# Patient Record
Sex: Male | Born: 1980
Health system: Southern US, Community
[De-identification: ages and names within clinical notes are randomized; demographics above are authoritative.]

## PROBLEM LIST (undated history)

## (undated) DIAGNOSIS — K589 Irritable bowel syndrome without diarrhea: Secondary | ICD-10-CM

## (undated) DIAGNOSIS — T7840XA Allergy, unspecified, initial encounter: Secondary | ICD-10-CM

## (undated) DIAGNOSIS — K921 Melena: Secondary | ICD-10-CM

## (undated) DIAGNOSIS — F988 Other specified behavioral and emotional disorders with onset usually occurring in childhood and adolescence: Secondary | ICD-10-CM

## (undated) HISTORY — DX: Melena: K92.1

## (undated) HISTORY — DX: Irritable bowel syndrome, unspecified: K58.9

## (undated) HISTORY — DX: Other specified behavioral and emotional disorders with onset usually occurring in childhood and adolescence: F98.8

## (undated) HISTORY — PX: WRIST SURGERY: SHX841

## (undated) HISTORY — PX: UPPER GASTROINTESTINAL ENDOSCOPY: SHX188

## (undated) HISTORY — DX: Allergy, unspecified, initial encounter: T78.40XA

---

## 2003-09-30 ENCOUNTER — Encounter: Payer: Self-pay | Admitting: Internal Medicine

## 2004-02-24 ENCOUNTER — Ambulatory Visit: Payer: Self-pay | Admitting: Internal Medicine

## 2004-04-29 ENCOUNTER — Ambulatory Visit: Payer: Self-pay | Admitting: Internal Medicine

## 2004-06-20 ENCOUNTER — Ambulatory Visit: Payer: Self-pay | Admitting: Internal Medicine

## 2004-07-11 ENCOUNTER — Ambulatory Visit: Payer: Self-pay | Admitting: Gastroenterology

## 2004-08-10 ENCOUNTER — Ambulatory Visit: Payer: Self-pay | Admitting: Gastroenterology

## 2004-08-18 ENCOUNTER — Encounter (INDEPENDENT_AMBULATORY_CARE_PROVIDER_SITE_OTHER): Payer: Self-pay | Admitting: Specialist

## 2004-08-18 ENCOUNTER — Ambulatory Visit: Payer: Self-pay | Admitting: Gastroenterology

## 2004-09-20 ENCOUNTER — Ambulatory Visit: Payer: Self-pay | Admitting: Gastroenterology

## 2005-06-28 ENCOUNTER — Ambulatory Visit: Payer: Self-pay | Admitting: Internal Medicine

## 2005-12-27 ENCOUNTER — Ambulatory Visit: Payer: Self-pay | Admitting: Internal Medicine

## 2006-06-27 ENCOUNTER — Ambulatory Visit: Payer: Self-pay | Admitting: Internal Medicine

## 2006-08-08 ENCOUNTER — Ambulatory Visit: Payer: Self-pay | Admitting: Internal Medicine

## 2006-08-15 ENCOUNTER — Ambulatory Visit (HOSPITAL_COMMUNITY): Admission: RE | Admit: 2006-08-15 | Discharge: 2006-08-15 | Payer: Self-pay | Admitting: Internal Medicine

## 2006-09-06 ENCOUNTER — Ambulatory Visit (HOSPITAL_COMMUNITY): Admission: RE | Admit: 2006-09-06 | Discharge: 2006-09-06 | Payer: Self-pay | Admitting: Internal Medicine

## 2006-09-17 ENCOUNTER — Ambulatory Visit: Payer: Self-pay | Admitting: Internal Medicine

## 2006-11-12 ENCOUNTER — Telehealth: Payer: Self-pay | Admitting: Internal Medicine

## 2006-11-27 DIAGNOSIS — F988 Other specified behavioral and emotional disorders with onset usually occurring in childhood and adolescence: Secondary | ICD-10-CM | POA: Insufficient documentation

## 2006-11-27 DIAGNOSIS — K589 Irritable bowel syndrome without diarrhea: Secondary | ICD-10-CM | POA: Insufficient documentation

## 2006-12-21 ENCOUNTER — Ambulatory Visit: Payer: Self-pay | Admitting: Internal Medicine

## 2007-02-11 ENCOUNTER — Telehealth: Payer: Self-pay | Admitting: Internal Medicine

## 2007-04-17 ENCOUNTER — Telehealth: Payer: Self-pay | Admitting: Internal Medicine

## 2007-05-20 ENCOUNTER — Ambulatory Visit: Payer: Self-pay | Admitting: Internal Medicine

## 2007-09-16 DIAGNOSIS — J309 Allergic rhinitis, unspecified: Secondary | ICD-10-CM | POA: Insufficient documentation

## 2007-09-16 DIAGNOSIS — R0602 Shortness of breath: Secondary | ICD-10-CM | POA: Insufficient documentation

## 2007-09-17 ENCOUNTER — Ambulatory Visit: Payer: Self-pay | Admitting: Internal Medicine

## 2007-09-22 DIAGNOSIS — J45909 Unspecified asthma, uncomplicated: Secondary | ICD-10-CM | POA: Insufficient documentation

## 2007-09-24 ENCOUNTER — Telehealth: Payer: Self-pay | Admitting: Internal Medicine

## 2007-10-30 ENCOUNTER — Telehealth: Payer: Self-pay | Admitting: Internal Medicine

## 2007-11-20 ENCOUNTER — Ambulatory Visit: Payer: Self-pay | Admitting: Internal Medicine

## 2008-01-21 ENCOUNTER — Telehealth: Payer: Self-pay | Admitting: Internal Medicine

## 2008-03-03 ENCOUNTER — Telehealth: Payer: Self-pay | Admitting: Internal Medicine

## 2008-04-13 ENCOUNTER — Telehealth: Payer: Self-pay | Admitting: Internal Medicine

## 2008-05-27 ENCOUNTER — Ambulatory Visit: Payer: Self-pay | Admitting: Internal Medicine

## 2008-06-25 ENCOUNTER — Telehealth: Payer: Self-pay | Admitting: Internal Medicine

## 2008-08-05 ENCOUNTER — Telehealth: Payer: Self-pay | Admitting: Internal Medicine

## 2008-09-08 ENCOUNTER — Telehealth: Payer: Self-pay | Admitting: Family Medicine

## 2008-10-19 ENCOUNTER — Telehealth: Payer: Self-pay | Admitting: *Deleted

## 2008-12-02 ENCOUNTER — Ambulatory Visit: Payer: Self-pay | Admitting: Internal Medicine

## 2008-12-02 DIAGNOSIS — D485 Neoplasm of uncertain behavior of skin: Secondary | ICD-10-CM | POA: Insufficient documentation

## 2009-04-20 ENCOUNTER — Telehealth: Payer: Self-pay | Admitting: Internal Medicine

## 2009-07-19 ENCOUNTER — Ambulatory Visit: Payer: Self-pay | Admitting: Internal Medicine

## 2009-11-17 ENCOUNTER — Telehealth: Payer: Self-pay | Admitting: Internal Medicine

## 2010-01-19 ENCOUNTER — Ambulatory Visit: Payer: Self-pay | Admitting: Internal Medicine

## 2010-01-19 DIAGNOSIS — R030 Elevated blood-pressure reading, without diagnosis of hypertension: Secondary | ICD-10-CM | POA: Insufficient documentation

## 2010-03-01 ENCOUNTER — Telehealth: Payer: Self-pay | Admitting: Internal Medicine

## 2010-04-05 ENCOUNTER — Telehealth: Payer: Self-pay | Admitting: *Deleted

## 2010-04-26 NOTE — Progress Notes (Signed)
Summary: refill  Phone Note Call from Patient Call back at Home Phone [phone]   Caller: Patient-live call Reason for Call: Refill Medication Summary of Call: Rf generic Adderall. Please call when ready. Initial call taken by: Warnell Forester,  April 20, 2009 1:15 PM  Follow-up for Phone Call        ROV in March 2011- Left message on machine that rx's are ready to pick up and that pt needs to schedule an appt in March 2011. Follow-up by: Romualdo Bolk, CMA (AAMA),  April 20, 2009 4:16 PM    New/Updated Medications: AMPHETAMINE-DEXTROAMPHETAMINE 30 MG XR24H-CAP (AMPHETAMINE-DEXTROAMPHETAMINE) 1 by mouth once daily Fill on or after 05/22/2009 AMPHETAMINE-DEXTROAMPHETAMINE 30 MG XR24H-CAP (AMPHETAMINE-DEXTROAMPHETAMINE) 1 by mouth once daily Fill on or after 06/18/2009 Prescriptions: AMPHETAMINE-DEXTROAMPHETAMINE 30 MG XR24H-CAP (AMPHETAMINE-DEXTROAMPHETAMINE) 1 by mouth once daily Fill on or after 06/18/2009  #30 x 0   Entered by:   Romualdo Bolk, CMA (AAMA)   Authorized by:   Madelin Headings MD   Signed by:   Romualdo Bolk, CMA (AAMA) on 04/20/2009   Method used:   Print then Give to Patient   RxID:   [id number] AMPHETAMINE-DEXTROAMPHETAMINE 30 MG XR24H-CAP (AMPHETAMINE-DEXTROAMPHETAMINE) 1 by mouth once daily Fill on or after 05/22/2009  #30 x 0   Entered by:   Romualdo Bolk, CMA (AAMA)   Authorized by:   Madelin Headings MD   Signed by:   Romualdo Bolk, CMA (AAMA) on 04/20/2009   Method used:   Print then Give to Patient   RxID:   [id number] AMPHETAMINE-DEXTROAMPHETAMINE 30 MG XR24H-CAP (AMPHETAMINE-DEXTROAMPHETAMINE) 1 by mouth once daily  #30 x 0   Entered by:   Romualdo Bolk, CMA (AAMA)   Authorized by:   Madelin Headings MD   Signed by:   Romualdo Bolk, CMA (AAMA) on 04/20/2009   Method used:   Print then Give to Patient   RxID:   [id number]

## 2010-04-26 NOTE — Assessment & Plan Note (Signed)
Summary: 6 month rov/njr   Vital Signs:  Patient profile:   30 year old male Height:      69 inches Weight:      172 pounds Pulse rate:   72 / minute BP sitting:   130 / 90  (right arm) Cuff size:   regular  Vitals Entered By: Romualdo Bolk, CMA (AAMA) (January 19, 2010 3:41 PM)  Serial Vital Signs/Assessments:  Time      Position  BP       Pulse  Resp  Temp     By                     138/86                         Madelin Headings MD  Comments: right arm sitting reg cuff By: Madelin Headings MD   CC: Pt is here for a follow up on add meds. Pt states that the dosage is causing him not to go to sleep as early as he would like.   History of Present Illness: Jonathan [name]  comes in today  for follow up of meds.  Medications helps with work but > if lower dose would help with sleep . Marland Kitchen? sleep issues.  hard to fall asleep.   more recently .     no sig alcohol intake.  take am dose   830  no appetite change  no  sig caffiene.  not as much exercising  running.   NO cp sob mood issues  . asthma is stable.  GI stable.  Preventive Screening-Counseling & Management  Alcohol-Tobacco     Alcohol drinks/day: <1     Alcohol type: beer     Smoking Status: never  Caffeine-Diet-Exercise     Caffeine use/day: 1     Does Patient Exercise: yes  Current Medications (verified): 1)  Allegra 180 Mg  Tabs (Fexofenadine Hcl) .... Take 1 Tablet By Mouth Once A Day 2)  Proair Hfa 108 (90 Base) Mcg/act  Aers (Albuterol Sulfate) .... 2 Puffs Four Times A Day Prn 3)  Amphetamine-Dextroamphetamine 30 Mg Xr24h-Cap (Amphetamine-Dextroamphetamine) .Marland Kitchen.. 1 By Mouth Once Daily 4)  Amphetamine-Dextroamphetamine 15 Mg Xr24h-Cap (Amphetamine-Dextroamphetamine) .... 2 By Mouth Once Daily Fill On or After 01/17/10 5)  Amphetamine-Dextroamphetamine 15 Mg Xr24h-Cap (Amphetamine-Dextroamphetamine) .... 2 By Mouth Once Daily Fill On or After 12/18/09 6)  Amphetamine-Dextroamphetamine 15 Mg Xr24h-Cap  (Amphetamine-Dextroamphetamine) .... 2 By Mouth Once Daily  Allergies (verified): 1)  ! * Antibiotics  Past History:  Past medical, surgical, family and social histories (including risk factors) reviewed, and no changes noted (except as noted below).  Past Medical History: Reviewed history from 05/27/2008 and no changes required. ADD IBS Allergic Rhinitis Asthma   Consults; Pulmonary   Young  Past Surgical History: Reviewed history from 05/27/2008 and no changes required. eval by GI--exp endoscopy neg   Past History:  Care Management: Pulmonary: Young in the past  Family History: Reviewed history from 12/02/2008 and no changes required. gm had mi age 27 neg scd      Social History: Reviewed history from 12/02/2008 and no changes required. 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         no cp sob Gi gu asthma stable now  spring ocass signs   Physical Exam  General:  Well-developed,well-nourished,in no acute distress; alert,appropriate and cooperative throughout examination Head:  normocephalic and atraumatic.   Eyes:  vision grossly intact, pupils equal, and pupils round.   Mouth:  pharynx pink and moist.   Neck:  No deformities, masses, or tenderness noted. no bruits Lungs:  Normal respiratory effort, chest expands symmetrically. Lungs are clear to auscultation, no crackles or wheezes. Heart:  Normal rate and regular rhythm. S1 and S2 normal without gallop, murmur, click, rub or other extra sounds. Abdomen:  Bowel sounds positive,abdomen soft and non-tender without masses, organomegaly or    noted. Pulses:  pulses intact without delay   Extremities:  no clubbing cyanosis or edema  Neurologic:  alert & oriented X3 and strength normal in all extremities.  non focal  Skin:  turgor normal, color normal, no ecchymoses, and no petechiae.   Cervical Nodes:  No lymphadenopathy noted Psych:  Oriented X3, normally interactive, good eye contact, not anxious appearing, and not depressed appearing.     Impression & Recommendations:  Problem # 1:  ADD (ICD-314.00) disc meds  and  worth tryning a lower dose  for now .    Problem # 2:  ELEVATED BLOOD PRESSURE WITHOUT DIAGNOSIS OF HYPERTENSION (ICD-796.2) Assessment: New borderline  today  poss related to change in exercise.   rec  take readings  and call if elevated.   Problem # 3:  sleep disc sleep  hygiene and disc exercise   Complete Medication List: 1)  Allegra 180 Mg Tabs (Fexofenadine hcl) .... Take 1 tablet by mouth once a day 2)  Proair Hfa 108 (90 Base) Mcg/act Aers (Albuterol sulfate) .... 2 puffs four times a day prn 3)  Amphetamine-dextroamphetamine 15 Mg Xr24h-cap (Amphetamine-dextroamphetamine) .... 2 by mouth once daily fill on or after 12/18/09 4)  Amphetamine-dextroamphetamine 25 Mg Xr24h-cap (Amphetamine-dextroamphetamine) .Marland Kitchen.. 1 by mouth once daily  Other Orders: Admin 1st Vaccine (16109) Flu Vaccine 77yrs + (60454)  Patient Instructions: 1)  can try lower dose  med  2)  increase some exercise and this may help sleep. 3)  Call in a month about  dosing  effect and new rx. 4)  CPX  with labs   in 6 months  5)  Get Bp checked  getting your own machine may help monitor this . 6)  belwo 140/90 is goal  120/80 is optimum. Prescriptions: AMPHETAMINE-DEXTROAMPHETAMINE 25 MG XR24H-CAP (AMPHETAMINE-DEXTROAMPHETAMINE) 1 by mouth once daily  #30 x 0   Entered and Authorized by:   Madelin Headings MD   Signed by:   Madelin Headings MD on 01/19/2010   Method used:   Print then Give to Patient   RxID:    [id number]    Orders Added: 1)  Admin 1st Vaccine [90471] 2)  Flu Vaccine 90yrs + [30865] 3)  Est. Patient Level IV [78469] Flu Vaccine Consent Questions     Do you have a history of severe allergic reactions to this vaccine? no    Any prior history of allergic reactions to egg and/or gelatin? no    Do you have a sensitivity to the preservative Thimersol? no    Do you have a past history of Guillan-Barre Syndrome? no    Do you currently have an  acute febrile illness? no    Have you ever had a severe reaction to latex? no    Vaccine information given and explained to patient? yes    Are you currently pregnant? no    Lot Number:[id number]   Exp Date:09/24/2010   Site Given  Left Deltoid IMShannon S Cranford, CMA (AAMA)  January 19, 2010 3:44 PM  Vaccine 47yrs + [04540]        .lbflu

## 2010-04-26 NOTE — Progress Notes (Signed)
Summary: REFILL REQUEST  Phone Note Refill Request Message from:  Patient on March 01, 2010 12:10 PM  Refills Requested: Medication #1:  AMPHETAMINE-DEXTROAMPHETAMINE 25 MG XR24H-CAP 1 by mouth once daily.   Notes: Pt can be reached at [phone] when Rx is ready for p/u.    Initial call taken by: Debbra Riding,  March 01, 2010 12:10 PM  Follow-up for Phone Call        Pt aware that rx will be ready in the am. Follow-up by: Romualdo Bolk, CMA (AAMA),  March 01, 2010 2:52 PM    Prescriptions: AMPHETAMINE-DEXTROAMPHETAMINE 25 MG XR24H-CAP (AMPHETAMINE-DEXTROAMPHETAMINE) 1 by mouth once daily  #30 x 0   Entered by:   Romualdo Bolk, CMA (AAMA)   Authorized by:   Madelin Headings MD   Signed by:   Romualdo Bolk, CMA (AAMA) on 03/01/2010   Method used:   Print then Give to Patient   RxID:   [id number]

## 2010-04-26 NOTE — Assessment & Plan Note (Signed)
Summary: 6 mo rov/mm rsc bmp/njr-----PT [facility] // [facility]   Vital Signs:  Patient profile:   30 year old male Height:      69 inches Weight:      172 pounds BMI:     25.49 Pulse rate:   78 / minute BP sitting:   120 / 80  (right arm) Cuff size:   regular  Vitals Entered By: Romualdo Bolk, CMA (AAMA) (July 19, 2009 4:01 PM) CC: Follow-up visit on add meds- Pt is having a hard time getting adderall xr 30mg  due to it being on backorder   History of Present Illness: Jonathan [name] comesin comes in today  for above  .   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 Asthma  :   Ocass   asthma   inhaler Use exp in season . Stable and no need for controller meds . Some increase allergy symptom  this spring  taking as needed . GI stable :   Mood: no  concerns currently    Preventive Screening-Counseling & Management  Alcohol-Tobacco     Alcohol drinks/day: <1     Alcohol type: beer     Smoking Status: never  Caffeine-Diet-Exercise     Caffeine use/day: 1     Does Patient Exercise: yes  Current Medications (verified): 1)  Allegra 180 Mg  Tabs (Fexofenadine Hcl) .... Take 1 Tablet By Mouth Once A Day 2)  Proair Hfa 108 (90 Base) Mcg/act  Aers (Albuterol Sulfate) .... 2 Puffs Four Times A Day Prn 3)  Amphetamine-Dextroamphetamine 30 Mg Xr24h-Cap (Amphetamine-Dextroamphetamine) .Marland Kitchen.. 1 By Mouth Once Daily 4)  Amphetamine-Dextroamphetamine 30 Mg Xr24h-Cap (Amphetamine-Dextroamphetamine) .Marland Kitchen.. 1 By Mouth Once Daily Fill On or After 05/22/2009 5)  Amphetamine-Dextroamphetamine 30 Mg Xr24h-Cap (Amphetamine-Dextroamphetamine) .Marland Kitchen.. 1 By Mouth Once Daily Fill On or After 06/18/2009  Allergies (verified): 1)  ! * Antibiotics  Past History:  Past medical, surgical, family and social histories (including risk factors) reviewed, and no changes noted (except as noted below).  Past Medical History: Reviewed history from  05/27/2008 and no changes required. ADD IBS Allergic Rhinitis Asthma   Consults; Pulmonary   Young  Past Surgical History: Reviewed history from 05/27/2008 and no changes required. eval by GI--exp endoscopy neg   Past History:  Care Management: Pulmonary: Young in the past  Family History: Reviewed history from 12/02/2008 and no changes required. gm had mi age 67 neg scd      Social History: Reviewed history from 12/02/2008 and no changes required. 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    Physical Exam  General:  Well-developed,well-nourished,in no acute distress; alert,appropriate and cooperative throughout examination Head:  normocephalic and atraumatic.   Eyes:  PERRL, EOMs full, conjunctiva clear  Ears:  R ear normal and L ear normal.   Nose:  mmild congestion Neck:  No deformities, masses, or tenderness noted. Lungs:  Normal respiratory effort, chest expands symmetrically. Lungs are clear to auscultation, no crackles or wheezes. Heart:  Normal rate and regular rhythm. S1 and S2 normal without gallop, murmur,  click, rub or other extra sounds.no lifts.   Abdomen:  Bowel sounds positive,abdomen soft and non-tender without masses, organomegaly or   noted. Pulses:  pulses intact without delay   Neurologic:  alert & oriented X3, strength normal in all extremities, and gait normal.  no tremor  Skin:  turgor normal and color normal.   Cervical Nodes:  No lymphadenopathy noted Psych:  Oriented X3, good eye contact, not anxious appearing, and not depressed appearing.     Impression & Recommendations:  Problem # 1:  ADD (ICD-314.00) Assessment  Unchanged continue medication with dose equivalent for now  Problem # 2:  ALLERGIC RHINITIS (ICD-477.9) Assessment: Deteriorated inseason and discussed control strategy  can use rescue in haler as needed. His updated medication list for this problem includes:    Allegra 180 Mg Tabs (Fexofenadine hcl) .Marland Kitchen... Take 1 tablet by mouth once a day  Problem # 3:  ASTHMA (ICD-493.90) Assessment: Deteriorated counseled about plans   call if persistent or  progressive   His updated medication list for this problem includes:    Proair Hfa 108 (90 Base) Mcg/act Aers (Albuterol sulfate) .Marland Kitchen... 2 puffs four times a day prn  Complete Medication List: 1)  Allegra 180 Mg Tabs (Fexofenadine hcl) .... Take 1 tablet by mouth once a day 2)  Proair Hfa 108 (90 Base) Mcg/act Aers (Albuterol sulfate) .... 2 puffs four times a day prn 3)  Amphetamine-dextroamphetamine 30 Mg Xr24h-cap (Amphetamine-dextroamphetamine) .Marland Kitchen.. 1 by mouth once daily 4)  Amphetamine-dextroamphetamine 15 Mg Xr24h-cap (Amphetamine-dextroamphetamine) .... 2 by mouth once daily fill on or after 09/18/09 5)  Amphetamine-dextroamphetamine 15 Mg Xr24h-cap (Amphetamine-dextroamphetamine) .... 2 by mouth once daily fill on or after 08/18/09 6)  Amphetamine-dextroamphetamine 15 Mg Xr24h-cap (Amphetamine-dextroamphetamine) .... 2 by mouth once daily  Patient Instructions: 1)  continue meds     2)  inhaler  as needed.    can use before exercise . 3)  call if   problems. 4)  Antihistamine otc is ok for your allergies.  5)  Please schedule a follow-up appointment in 6 months .  Prescriptions: PROAIR HFA 108 (90 BASE) MCG/ACT  AERS (ALBUTEROL SULFATE) 2 puffs four times a day prn  #1 x 2   Entered and Authorized by:   Madelin Headings MD   Signed by:   Madelin Headings MD on 07/19/2009   Method used:   Print then Give to Patient   RxID:   [id number] AMPHETAMINE-DEXTROAMPHETAMINE 15 MG XR24H-CAP (AMPHETAMINE-DEXTROAMPHETAMINE) 2 by mouth once daily  Fill on or after 09/18/09  #60 x 0   Entered by:   Romualdo Bolk, CMA (AAMA)   Authorized by:   Madelin Headings MD   Signed by:   Romualdo Bolk, CMA (AAMA) on 07/19/2009   Method used:   Print then Give to Patient   RxID:   [id number] AMPHETAMINE-DEXTROAMPHETAMINE 15 MG XR24H-CAP (AMPHETAMINE-DEXTROAMPHETAMINE) 2 by mouth once daily fill on or after 08/18/09  #60 x 0   Entered by:   Romualdo Bolk, CMA (AAMA)   Authorized by:   Madelin Headings MD   Signed by:   Romualdo Bolk, CMA (AAMA) on 07/19/2009   Method used:   Print then Give to Patient   RxID:   [id number] AMPHETAMINE-DEXTROAMPHETAMINE 15 MG XR24H-CAP (AMPHETAMINE-DEXTROAMPHETAMINE) 2 by mouth once daily  #60 x 0   Entered by:   Romualdo Bolk, CMA (AAMA)   Authorized by:   Madelin Headings MD   Signed  by:   Romualdo Bolk, CMA (AAMA) on 07/19/2009   Method used:   Print then Give to Patient   RxID:   [id number]

## 2010-04-26 NOTE — Progress Notes (Signed)
Summary: Pt req scripts for Adderall   Phone Note Refill Request Call back at Home Phone [phone] Message from:  Patient on November 17, 2009 1:05 PM  Refills Requested: Medication #1:  AMPHETAMINE-DEXTROAMPHETAMINE 30 MG XR24H-CAP 1 by mouth once daily  Medication #2:  AMPHETAMINE-DEXTROAMPHETAMINE 15 MG XR24H-CAP 2 by mouth once daily Fill on or after 09/18/09  Medication #3:  AMPHETAMINE-DEXTROAMPHETAMINE 15 MG XR24H-CAP 2 by mouth once daily fill on or after 08/18/09  Medication #4:  AMPHETAMINE-DEXTROAMPHETAMINE 15 MG XR24H-CAP 2 by mouth once daily.  Method Requested: Pick up at Office Initial call taken by: Lucy Antigua,  November 17, 2009 1:05 PM  Follow-up for Phone Call        Pt aware that rx is ready to pick up Follow-up by: Romualdo Bolk, CMA Duncan Dull),  November 17, 2009 5:35 PM    New/Updated Medications: AMPHETAMINE-DEXTROAMPHETAMINE 15 MG XR24H-CAP (AMPHETAMINE-DEXTROAMPHETAMINE) 2 by mouth once daily Fill on or after 01/17/10 AMPHETAMINE-DEXTROAMPHETAMINE 15 MG XR24H-CAP (AMPHETAMINE-DEXTROAMPHETAMINE) 2 by mouth once daily fill on or after 12/18/09 Prescriptions: AMPHETAMINE-DEXTROAMPHETAMINE 15 MG XR24H-CAP (AMPHETAMINE-DEXTROAMPHETAMINE) 2 by mouth once daily Fill on or after 01/17/10  #60 x 0   Entered by:   Romualdo Bolk, CMA (AAMA)   Authorized by:   Madelin Headings MD   Signed by:   Romualdo Bolk, CMA (AAMA) on 11/17/2009   Method used:   Print then Give to Patient   RxID:   [id number] AMPHETAMINE-DEXTROAMPHETAMINE 15 MG XR24H-CAP (AMPHETAMINE-DEXTROAMPHETAMINE) 2 by mouth once daily fill on or after 12/18/09  #60 x 0   Entered by:   Romualdo Bolk, CMA (AAMA)   Authorized by:   Madelin Headings MD   Signed by:   Romualdo Bolk, CMA (AAMA) on 11/17/2009   Method used:   Print then Give to Patient   RxID:   [id number] AMPHETAMINE-DEXTROAMPHETAMINE 15 MG XR24H-CAP (AMPHETAMINE-DEXTROAMPHETAMINE) 2 by mouth once daily  #60 x 0  Entered by:   Romualdo Bolk, CMA (AAMA)   Authorized by:   Madelin Headings MD   Signed by:   Romualdo Bolk, CMA (AAMA) on 11/17/2009   Method used:   Print then Give to Patient   RxID:   [id number]

## 2010-04-28 NOTE — Progress Notes (Signed)
Summary: new rx  Phone Note Refill Request Message from:  Patient  Refills Requested: Medication #1:  AMPHETAMINE-DEXTROAMPHETAMINE 25 MG XR24H-CAP 1 by mouth once daily. pt will like 3 month rx pt is out of meds  Initial call taken by: Heron Sabins,  April 05, 2010 12:52 PM  Follow-up for Phone Call        done for one month  Follow-up by: Nelwyn Salisbury MD,  April 05, 2010 3:09 PM  Additional Follow-up for Phone Call Additional follow up Details #1::        Pt aware that rx is ready to pick up. Additional Follow-up by: Romualdo Bolk, CMA (AAMA),  April 05, 2010 3:12 PM    Prescriptions: AMPHETAMINE-DEXTROAMPHETAMINE 25 MG XR24H-CAP (AMPHETAMINE-DEXTROAMPHETAMINE) 1 by mouth once daily  #30 x 0   Entered and Authorized by:   Nelwyn Salisbury MD   Signed by:   Nelwyn Salisbury MD on 04/05/2010   Method used:   Print then Give to Patient   RxID:   [id number]

## 2010-05-16 ENCOUNTER — Telehealth: Payer: Self-pay | Admitting: Internal Medicine

## 2010-05-16 MED ORDER — AMPHETAMINE-DEXTROAMPHET ER 25 MG PO CP24: ORAL_CAPSULE | ORAL | Status: DC

## 2010-05-16 MED ORDER — AMPHETAMINE-DEXTROAMPHET ER 25 MG PO CP24: 25.0000 mg | ORAL_CAPSULE | ORAL | Status: DC

## 2010-05-16 NOTE — Telephone Encounter (Signed)
Refill generic Adderall. Call pt when ready.

## 2010-05-16 NOTE — Telephone Encounter (Signed)
Pt has an appt on 07/19/2010

## 2010-07-14 ENCOUNTER — Encounter: Payer: Self-pay | Admitting: Internal Medicine

## 2010-07-19 ENCOUNTER — Encounter: Payer: Self-pay | Admitting: Internal Medicine

## 2010-07-19 ENCOUNTER — Ambulatory Visit (INDEPENDENT_AMBULATORY_CARE_PROVIDER_SITE_OTHER): Payer: BC Managed Care – PPO | Admitting: Internal Medicine

## 2010-07-19 VITALS — BP 120/80 | HR 72 | Ht 69.25 in | Wt 168.0 lb

## 2010-07-19 DIAGNOSIS — F988 Other specified behavioral and emotional disorders with onset usually occurring in childhood and adolescence: Secondary | ICD-10-CM

## 2010-07-19 DIAGNOSIS — Z23 Encounter for immunization: Secondary | ICD-10-CM

## 2010-07-19 DIAGNOSIS — Z0000 Encounter for general adult medical examination without abnormal findings: Secondary | ICD-10-CM

## 2010-07-19 DIAGNOSIS — J45909 Unspecified asthma, uncomplicated: Secondary | ICD-10-CM

## 2010-07-19 DIAGNOSIS — J309 Allergic rhinitis, unspecified: Secondary | ICD-10-CM

## 2010-07-19 LAB — BASIC METABOLIC PANEL
CO2: 30 mEq/L (ref 19–32)
Chloride: 105 mEq/L (ref 96–112)
Creatinine, Ser: 0.9 mg/dL (ref 0.4–1.5)
Sodium: 142 mEq/L (ref 135–145)

## 2010-07-19 LAB — HEPATIC FUNCTION PANEL
ALT: 28 U/L (ref 0–53)
Alkaline Phosphatase: 73 U/L (ref 39–117)
Bilirubin, Direct: 0.1 mg/dL (ref 0.0–0.3)
Total Bilirubin: 0.8 mg/dL (ref 0.3–1.2)
Total Protein: 7.1 g/dL (ref 6.0–8.3)

## 2010-07-19 LAB — CBC WITH DIFFERENTIAL/PLATELET
Basophils Absolute: 0 10*3/uL (ref 0.0–0.1)
Basophils Relative: 0.4 % (ref 0.0–3.0)
Eosinophils Absolute: 0.1 10*3/uL (ref 0.0–0.7)
Hemoglobin: 15.4 g/dL (ref 13.0–17.0)
Lymphocytes Relative: 28.4 % (ref 12.0–46.0)
MCHC: 35.3 g/dL (ref 30.0–36.0)
MCV: 94.9 fl (ref 78.0–100.0)
Monocytes Absolute: 0.5 10*3/uL (ref 0.1–1.0)
Neutro Abs: 2.7 10*3/uL (ref 1.4–7.7)
Neutrophils Relative %: 57.9 % (ref 43.0–77.0)
RBC: 4.59 Mil/uL (ref 4.22–5.81)
RDW: 13.2 % (ref 11.5–14.6)

## 2010-07-19 LAB — POCT URINALYSIS DIPSTICK
Blood, UA: NEGATIVE
Spec Grav, UA: 1.025
pH, UA: 5.5

## 2010-07-19 LAB — LIPID PANEL
LDL Cholesterol: 126 mg/dL — ABNORMAL HIGH (ref 0–99)
Total CHOL/HDL Ratio: 4

## 2010-07-19 MED ORDER — AMPHETAMINE-DEXTROAMPHET ER 25 MG PO CP24: ORAL_CAPSULE | ORAL | Status: DC

## 2010-07-19 NOTE — Patient Instructions (Signed)
Continue lifestyle intervention healthy eating and exercise . Call if moles change as discussed return office visit in 6 months or as needed

## 2010-07-19 NOTE — Progress Notes (Signed)
  Subjective:    Patient ID: Jonathan [name], male    DOB: 1980-08-27, 30 y.o.   MRN: [id number]  HPI Patient comes in today for checkup and a visit and medicine check. Since his last visit he has had no major changes in his health status and has begun to do regular exercise. He is still using his Adderall XR 25 mg on most days of the week and every day when working. No other usual side effects of medications as we discussed blood pressure has been better since he has been exercising. 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  Review of Systems Negative for asthma flares chest pain shortness of breath her IBS flare. He is taking over-the-counter Claritin for allergies and controlled. No major changes in hearing vision bowel habits orthopedic issues change in skin moles.  Past history family history social history reviewed in the electronic medical record.     Objective:   Physical Exam Physical Exam: Vital signs reviewed ZDG:LOVF is a well-developed well-nourished alert cooperative  White male  who appears   stated age in no acute distress.  HEENT: normocephalic  traumatic , Eyes: PERRL EOM's full, conjunctiva clear, Nares: patent no deformity discharge or tenderness., Ears: no deformity EAC's clear TMs with normal landmarks. Mouth: clear OP, no lesions, edema.  Moist mucous membranes. Dentition in adequate repair. NECK: supple without masses, thyromegaly or bruits. CHEST/PULM:  Clear to auscultation and percussion breath sounds equal no wheeze , rales or rhonchi. No chest wall deformities or tenderness. CV: PMI is nondisplaced, S1 S2 no gallops, murmurs, rubs. Peripheral pulses are full without delay.No JVD .  ABDOMEN: Bowel sounds normal nontender  No guard or rebound, no hepato splenomegal no CVA tenderness.  No hernia. Extremtities:  No clubbing cyanosis or edema, no acute joint swelling or redness no focal atrophy NEURO:   Oriented x3, cranial nerves 3-12 appear to be intact, no obvious focal weakness,gait within normal limits no abnormal reflexes or asymmetrical SKIN: No acute rashes normal turgor, color, no bruising or petechiae. He has multiple moles on his back none suspicious  however there is a 1-2 mm dark mole on the right lower back that is not raised but questionable pulled slightly irregular under her magnification. There is a 45 mm flesh-colored 1 with some brown in the center but it is smooth and regular. PSYCH: Oriented, good eye contact, no obvious depression anxiety, cognition and judgment appear normal. LN:  No cervical axillary or inguinal adenopathy GU : nl test desc circ no hernia or adenopathy       Assessment & Plan:  Preventive Health Care Continue healthy diet update Tdap.  Laboratory studies to be done today declined HIV testing. low risk  ADD:  Continue same medication regimen recheck in 6 months. Three-month prescriptions given today. Asthma stable Allergic rhinitis controlled IBS  not an issue Multiple moles  skin check at followup call  w  changes as discussed

## 2010-07-20 ENCOUNTER — Encounter: Payer: Self-pay | Admitting: *Deleted

## 2010-07-20 ENCOUNTER — Encounter: Payer: Self-pay | Admitting: Internal Medicine

## 2010-08-09 NOTE — Assessment & Plan Note (Signed)
[facility]                             PULMONARY OFFICE NOTE   Jonathan [name], Jonathan [name]                       MRN:          [id number]  DATE:08/08/2006                            DOB:          December 05, 1980    PROBLEM:  Pulmonary consultation at the kind request of Dr. Fabian Sharp for  this 30 year old man with troubled breathing.   HISTORY:  He says that over the past year he has had intervals when he  feels unable to take a comfortable satisfying deep breath. These  episodes tend to last for several days to a week and sometimes will skip  3-4 weeks at a time when he does not notice the sensation. He is  somewhat more dyspneic if he runs and also in cold winter weather. He  does not notice wheeze or cough and does not recognize a relation to  stress. He had a similar episode several years ago, at which time he  tried a inhaler, but thinks symptoms resolved spontaneously. He has no  significant history of lung disease, but has had allergic rhinitis. He  does not notice any association of symptoms with his work, home, or  other location, or with positions such as sleeping.   MEDICATIONS:  1. Pamine.  2. Forte.  3. Adderall 15 mg.  4. Allegra.  5. Flonase.  Drug intolerance to an antibiotic that caused a rash, but he cannot  remember the name of it.   REVIEW OF SYSTEMS:  Dyspnea as per the history of present illness,  sneezing, and itching. Weight has been stable, no adenopathy. No  headache, bleeding, fever, chills, reflux, nausea, or vomiting.   PAST HISTORY:  Seasonal allergic rhinitis, itching and watering of eyes.  No history of pneumonia or asthma. No history of pneumonia, cardiac  disease, or clotting disorder. There are no surgeries. No intolerance to  latex, contrast dye, or aspirin. He has used a Land in the past  when he has pulled his back.   SOCIAL HISTORY:  Never smoked. Alcohol once or twice a week. He is not  married, lives with  roommate. 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   FAMILY HISTORY:  Negative for lung disease. Grandfather died of MI, was  a smoker. Mother of grandfather died of pneumonia.   OBJECTIVE:  VITAL SIGNS:  Weight 149 pounds, blood pressure 98/62, pulse  77, room air saturation 99%.  GENERAL:  Trim, alert, comfortable appearing young man.  SKIN:  No exposed rash.  ADENOPATHY:  None found.  HEENT:  Eyes not injected. Nose and throat clear. No neck vein  distension or strider. Voice quality is normal.  CHEST:  Breathing unlabored, no accessory muscle use.  LUNGS:  Fields are clear.  HEART:  Regular rhythm. Normal S1 and S2 with no murmur.  ABDOMEN:  No enlargement of the liver or spleen.  EXTREMITIES:  No tremor, cyanosis, clubbing, or edema.   IMPRESSION:  1. Intermittent dyspnea might reflect stress or anxiety, but could      also represent  mild asthma or esophageal tension, or a combination.  2. Allergic rhinitis, seasonal.   PLAN:  1. Sample albuterol rescue inhaler for trial with discussion.  2. Schedule pulmonary function test including methacholine inhalation      challenge test.  3. Schedule return 1 month, earlier p.r.n. I appreciate the chance to      meet him.     Clinton D. Maple Hudson, MD, Tonny Bollman, FACP  Electronically Signed    CDY/MedQ  DD: 08/08/2006  DT: 08/09/2006  Job #: [id number]   cc:   Neta Mends. Fabian Sharp, MD

## 2010-08-09 NOTE — Assessment & Plan Note (Signed)
[facility]                             PULMONARY OFFICE NOTE   Jonathan [name], Jonathan [name]                       MRN:          [id number]  DATE:09/17/2006                            DOB:          1980-08-08    PULMONARY FOLLOWUP:   PROBLEM LIST:  1. Intermittent dyspnea.  2. Allergic rhinitis.   HISTORY:  He is still occasionally aware of some shortness of breath,  saying metered inhaler seemed to help.  There has been no chest pain, no  significant cough, no leg swelling or acute problem.   OBJECTIVE:  VITAL SIGNS:  Weight 149 pounds, BP 110/74, pulse regular at  78, room air saturation 98%.  NECK:  There is no neck vein distention or stridor.  CHEST:  Very clear now, and breathing is unlabored.  He looks quite  normal.  HEART:  Sounds are distinctly normal.  ABDOMEN:  No enlargement of the liver or spleen.  EXTREMITIES:  No cyanosis, clubbing, or edema.   Pulmonary function tests showed normal spirometry, with insignificant  response to bronchodilator.  Measured total lung capacity and residual  volume were high and I suspect incorrect.  Diffusion was also markedly  high for measured volume.  There was probably a technical mistake,  although this could represent increased pulmonary blood flow or marked  polycythemia which are not evident on examination and I think quite  unlikely.  His methacholine inhalation challenge test on Aug 15, 2006  was abnormal, positive for hyperreactive airways after a baseline normal  spirometry.  This would favor an asthma-type pattern.   IMPRESSION:  Mild intermittent asthma.  Nonspecific dyspnea.  Some  pulmonary function tests results that are probably not valid and can be  repeated if necessary.   PLAN:  For now, I have suggested we treat this as a very mild  intermittent asthma using a rescue albuterol inhaler and watching for  circumstance.  I have refilled his Proventil HFA inhaler and scheduled  return in  one year, earlier p.r.n.     Clinton D. Maple Hudson, MD, Tonny Bollman, FACP  Electronically Signed    CDY/MedQ  DD: 09/23/2006  DT: 09/24/2006  Job #: [id number]   cc:   Neta Mends. Fabian Sharp, MD

## 2010-12-05 ENCOUNTER — Telehealth: Payer: Self-pay | Admitting: Internal Medicine

## 2010-12-05 MED ORDER — AMPHETAMINE-DEXTROAMPHET ER 25 MG PO CP24: ORAL_CAPSULE | ORAL | Status: DC

## 2010-12-05 NOTE — Telephone Encounter (Signed)
Rx ready to pick up

## 2010-12-05 NOTE — Telephone Encounter (Signed)
Refill generic Adderall. Thanks. °

## 2010-12-21 ENCOUNTER — Ambulatory Visit (INDEPENDENT_AMBULATORY_CARE_PROVIDER_SITE_OTHER): Payer: BC Managed Care – PPO | Admitting: Internal Medicine

## 2010-12-21 ENCOUNTER — Encounter: Payer: Self-pay | Admitting: Internal Medicine

## 2010-12-21 VITALS — BP 120/80 | HR 60 | Ht 69.25 in | Wt 165.0 lb

## 2010-12-21 DIAGNOSIS — D229 Melanocytic nevi, unspecified: Secondary | ICD-10-CM | POA: Insufficient documentation

## 2010-12-21 DIAGNOSIS — Z23 Encounter for immunization: Secondary | ICD-10-CM

## 2010-12-21 DIAGNOSIS — F988 Other specified behavioral and emotional disorders with onset usually occurring in childhood and adolescence: Secondary | ICD-10-CM

## 2010-12-21 DIAGNOSIS — D239 Other benign neoplasm of skin, unspecified: Secondary | ICD-10-CM

## 2010-12-21 NOTE — Progress Notes (Signed)
  Subjective:    Patient ID: Jonathan [name], male    DOB: Oct 11, 1980, 30 y.o.   MRN: [id number]  HPI Patient comes in today for followup of a couple of issues but mostly his ADD medication.  ADD meds still working.  no cp sob psych effects .  Exercising and sleeping ok.  Neg td social etoh.   Asthma;quiesent.    Exercising  Flu shot today   Review of Systems ROS:  GEN/ HEENTNo fever, significant weight changes sweats headaches vision problems hearing changes,  CV/ PULM; No chest pain shortness of breath cough, syncope,edema  change in exercise tolerance. GI /GU: No adominal pain, vomiting, change in bowel habits. No blood in the stool. No significant GU symptoms.  SKIN/HEME: ,no acute skin rashes suspicious lesions or bleeding. No lymphadenopathy, nodules, masses.  NEURO/ PSYCH:  No neurologic signs such as weakness numbness No depression anxiety. IMM/ Allergy: No unusual infections.  Allergy .  Usually in spring REST of 12 system review negative Past history family history social history reviewed in the electronic medical record.      Objective:   Physical Exam Physical Exam: Vital signs reviewed WUJ:WJXB is a well-developed well-nourished alert cooperative  White male  who appears   stated age in no acute distress.  HEENT: normocephalic  traumatic , Eyes: PERRL EOM's full, conjunctiva clear, Nares: patent no deformity discharge or tenderness., Ears: no deformity  Mouth: clear OP, no lesions, edema.  Moist mucous membranes. Dentition in adequate repair. NECK: supple without masses, thyromegaly or bruits. CHEST/PULM:  Clear to auscultation and percussion breath sounds equal no wheeze , rales or rhonchi. No chest wall deformities or tenderness. CV: PMI is nondisplaced, S1 S2 no gallops, murmurs, rubs. Peripheral pulses are full without delay.No  ABDOMEN: Bowel sounds normal nontender  No guard or rebound, no hepato splenomegal no CVA tenderness.   Extremtities:  No clubbing cyanosis  or edema, no acute joint swelling or redness no focal atrophy NEURO:  Oriented x3, cranial nerves 3-12 appear to be intact, no obvious focal weakness,gait within normal limits SKIN: normal turgor, color, no bruising or petechiae. Multiple moles  Right trunk very dark 2 mm mole and also 4 mm mole with central brown are  Slightly irreg.  PSYCH: Oriented, good eye contact, no obvious depression anxiety, cognition and judgment appear normal. LN:  No cervical l adenopathy     Assessment & Plan:  ADD:   stable  Disc meds  Long term   Acceptable risk call for refills  Multiple moles ? Atypical and one dark one   Unsure if need bx of not  Rec  Derm assessment.   Preventive Health Care Flu shot Continue lifestyle intervention healthy eating and exercise . Weight in healthy range now.

## 2010-12-21 NOTE — Patient Instructions (Signed)
Will contact your about dermatology consult. Continue lifestyle intervention healthy eating and exercise . Call for refill med . Wellness check in 6 months.

## 2010-12-29 ENCOUNTER — Other Ambulatory Visit: Payer: Self-pay | Admitting: Dermatology

## 2011-01-02 ENCOUNTER — Encounter: Payer: Self-pay | Admitting: Internal Medicine

## 2011-01-02 DIAGNOSIS — D239 Other benign neoplasm of skin, unspecified: Secondary | ICD-10-CM | POA: Insufficient documentation

## 2011-01-25 ENCOUNTER — Other Ambulatory Visit: Payer: Self-pay | Admitting: Dermatology

## 2011-03-16 ENCOUNTER — Telehealth: Payer: Self-pay | Admitting: Internal Medicine

## 2011-03-16 NOTE — Telephone Encounter (Signed)
Pt requesting refill on amphetamine-dextroamphetamine (ADDERALL XR, 25MG ,) 25 MG 24 hr capsule

## 2011-03-17 MED ORDER — AMPHETAMINE-DEXTROAMPHET ER 25 MG PO CP24: ORAL_CAPSULE | ORAL | Status: DC

## 2011-03-17 NOTE — Telephone Encounter (Signed)
Script is ready for pick up and left voice message. 

## 2011-03-17 NOTE — Telephone Encounter (Signed)
done

## 2011-05-15 ENCOUNTER — Other Ambulatory Visit: Payer: Self-pay | Admitting: Internal Medicine

## 2011-05-15 MED ORDER — AMPHETAMINE-DEXTROAMPHET ER 25 MG PO CP24: ORAL_CAPSULE | ORAL | Status: DC

## 2011-05-15 NOTE — Telephone Encounter (Signed)
Pt needs to have a follow in March. Rx to be ready in am. Left message on machine about this.

## 2011-05-15 NOTE — Telephone Encounter (Signed)
Pt need new rx generic addderall xr 25 mg

## 2011-06-16 ENCOUNTER — Other Ambulatory Visit: Payer: Self-pay | Admitting: Internal Medicine

## 2011-06-16 MED ORDER — AMPHETAMINE-DEXTROAMPHET ER 25 MG PO CP24: ORAL_CAPSULE | ORAL | Status: DC

## 2011-06-16 NOTE — Telephone Encounter (Signed)
Pt needs new rx generic adderall xr 25mg . Pt would like to pick up rx today. Pt is going out of town.

## 2011-06-16 NOTE — Telephone Encounter (Signed)
rx up front ready for p/u, pt will need OV, only 1 mth given, pt aware

## 2011-06-21 ENCOUNTER — Ambulatory Visit: Payer: BC Managed Care – PPO | Admitting: Internal Medicine

## 2011-07-21 ENCOUNTER — Encounter: Payer: Self-pay | Admitting: Internal Medicine

## 2011-07-21 ENCOUNTER — Ambulatory Visit (INDEPENDENT_AMBULATORY_CARE_PROVIDER_SITE_OTHER): Payer: BC Managed Care – PPO | Admitting: Internal Medicine

## 2011-07-21 VITALS — BP 100/78 | HR 99 | Temp 98.3°F | Ht 73.0 in | Wt 169.0 lb

## 2011-07-21 DIAGNOSIS — F988 Other specified behavioral and emotional disorders with onset usually occurring in childhood and adolescence: Secondary | ICD-10-CM

## 2011-07-21 DIAGNOSIS — Z0000 Encounter for general adult medical examination without abnormal findings: Secondary | ICD-10-CM

## 2011-07-21 DIAGNOSIS — J309 Allergic rhinitis, unspecified: Secondary | ICD-10-CM

## 2011-07-21 DIAGNOSIS — J45909 Unspecified asthma, uncomplicated: Secondary | ICD-10-CM

## 2011-07-21 LAB — LIPID PANEL
HDL: 56.7 mg/dL (ref >39.00)
Triglycerides: 58 mg/dL (ref 0.0–149.0)

## 2011-07-21 LAB — CBC WITH DIFFERENTIAL/PLATELET
Basophils Relative: 0.4 % (ref 0.0–3.0)
Eosinophils Absolute: 0.1 10*3/uL (ref 0.0–0.7)
Hemoglobin: 15 g/dL (ref 13.0–17.0)
Lymphs Abs: 1.3 10*3/uL (ref 0.7–4.0)
MCHC: 34.5 g/dL (ref 30.0–36.0)
MCV: 93.8 fl (ref 78.0–100.0)
Monocytes Absolute: 0.4 10*3/uL (ref 0.1–1.0)
Neutro Abs: 2.8 10*3/uL (ref 1.4–7.7)
RBC: 4.63 Mil/uL (ref 4.22–5.81)

## 2011-07-21 LAB — BASIC METABOLIC PANEL
CO2: 26 mEq/L (ref 19–32)
Calcium: 9.1 mg/dL (ref 8.4–10.5)
Creatinine, Ser: 1 mg/dL (ref 0.4–1.5)
Glucose, Bld: 72 mg/dL (ref 70–99)

## 2011-07-21 LAB — HEPATIC FUNCTION PANEL
Albumin: 4.4 g/dL (ref 3.5–5.2)
Total Protein: 7.3 g/dL (ref 6.0–8.3)

## 2011-07-21 MED ORDER — AMPHETAMINE-DEXTROAMPHET ER 25 MG PO CP24: ORAL_CAPSULE | ORAL | Status: DC

## 2011-07-21 MED ORDER — AMPHETAMINE-DEXTROAMPHET ER 25 MG PO CP24: 25.0000 mg | ORAL_CAPSULE | ORAL | Status: DC

## 2011-07-21 NOTE — Progress Notes (Signed)
Subjective:    Patient ID: Jonathan [name], male    DOB: 1980/10/04, 31 y.o.   MRN: [id number]  HPI Patient comes in today for Preventive Health Care visit  Also med check. Since last visit there's been no major change in his medical health no major injuries hospitalizations. His asthma is quiescent and he hasn't had to use an inhaler in a very long time. He has some minor allergy symptoms with itching in his nose and throat and uses over-the-counter meds for this. He continues to do regular exercise without limitation He had a dysplastic mole removed with wide excision on his back. ADD: Continues to use a medication on a regular basis works well needs refill. No untoward side effects sleep chest pain headaches. Mood. Sleep7 hours     Review of Systems ROS:  GEN/ HEENT: No fever, significant weight changes sweats headaches vision problems hearing changes, CV/ PULM; No chest pain shortness of breath cough, syncope,edema  change in exercise tolerance. GI /GU: No adominal pain, vomiting, change in bowel habits. No blood in the stool. No significant GU symptoms. SKIN/HEME: ,no acute skin rashes suspicious lesions or bleeding. No lymphadenopathy, nodules, masses.  NEURO/ PSYCH:  No neurologic signs such as weakness numbness. No depression anxiety. IMM/ Allergy: No unusual infections.  Allergy .  As per history of present illness REST of 12 system review negative except as per HPI  Past history family history social history reviewed in the electronic medical record. 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  HEENT: normocephalic  traumatic , Eyes: PERRL EOM's full, conjunctiva clear, Nares: patent no deformity discharge or tenderness., Ears: no deformity EAC's clear TMs with normal landmarks. Mouth: clear OP, no lesions, edema.  Moist mucous membranes. Dentition in adequate repair. NECK: supple without masses, thyromegaly or bruits. CHEST/PULM:  Clear to auscultation and percussion breath sounds equal no wheeze , rales or rhonchi. No chest wall deformities or tenderness. CV: PMI is nondisplaced, S1 S2 no gallops, murmurs, rubs. Peripheral pulses are full without delay.No JVD .  ABDOMEN: Bowel sounds normal nontender  No guard or rebound, no hepato splenomegal no CVA tenderness.  No hernia. Extremtities:  No clubbing cyanosis or edema, no acute joint swelling or redness no focal atrophy NEURO:  Oriented x3, cranial nerves 3-12 appear to be intact, no obvious focal weakness,gait within normal limits no abnormal reflexes or asymmetrical SKIN: No acute rashes normal turgor, color, no bruising or petechiae. Multiple moles healed scar on back PSYCH: Oriented, good eye contact, no obvious depression anxiety, cognition and judgment appear normal. LN:  No cervical axillary or inguinal adenopathy      Assessment & Plan:  Preventive Health Care Counseled regarding healthy nutrition, exercise, sleep, injury prevention, calcium vit d and healthy weight . UTD on immuniz  discussed Pneumovax but he really has no active asthma at this time but he should get regular flu shots.  ADD  Continue  On same meds no sig se .  Benefit more than  risk. Refill 3 months   ROV in 6 months for med checkl  Hx of dysplastic mole  Monitoring per derm.

## 2011-07-21 NOTE — Assessment & Plan Note (Signed)
Currently quiescent has not used rescue inhaler in quite a while

## 2011-07-21 NOTE — Assessment & Plan Note (Signed)
Currently otc in season controlled.

## 2011-07-21 NOTE — Patient Instructions (Signed)
Will notify you  of labs when available. Continue lifestyle intervention healthy eating and exercise . Call for med refill when run out. Check in to 90 day scrips.  ROV in 6 months for med checks

## 2011-07-27 NOTE — Progress Notes (Signed)
Quick Note:  Left a message for return call. ______ 

## 2011-08-01 NOTE — Progress Notes (Signed)
Quick Note:  Left a message for pt to return call. ______ 

## 2011-11-06 ENCOUNTER — Telehealth: Payer: Self-pay | Admitting: Internal Medicine

## 2011-11-06 NOTE — Telephone Encounter (Signed)
Pt needs new rx generic adderall xr 25 mg. Pt is requesting 3 month rxs

## 2011-11-07 ENCOUNTER — Other Ambulatory Visit: Payer: Self-pay | Admitting: Internal Medicine

## 2011-11-07 MED ORDER — AMPHETAMINE-DEXTROAMPHET ER 25 MG PO CP24: ORAL_CAPSULE | ORAL | Status: DC

## 2011-11-07 MED ORDER — AMPHETAMINE-DEXTROAMPHET ER 25 MG PO CP24: 25.0000 mg | ORAL_CAPSULE | ORAL | Status: DC

## 2011-11-07 NOTE — Telephone Encounter (Signed)
Printed and waiting on signature from Dr. Clent Ridges who is covering for [facility].

## 2011-11-07 NOTE — Telephone Encounter (Signed)
Pt called to check on status of getting 3 month script for generic adderall xr 25 mg. Pt has been informed that Dr Fabian Sharp is out of the office this wk.

## 2011-11-07 NOTE — Telephone Encounter (Signed)
Left message asking if he wants 90 day rx or 3 separate rx.  Waiting on response.

## 2011-11-07 NOTE — Telephone Encounter (Signed)
Pt would like 3 separate rx

## 2011-11-07 NOTE — Telephone Encounter (Signed)
Pt notified of 30 day rx.  WP out of the office for the week.  Dr. Clent Ridges gave rx.

## 2011-12-06 ENCOUNTER — Other Ambulatory Visit: Payer: Self-pay | Admitting: Internal Medicine

## 2011-12-06 MED ORDER — AMPHETAMINE-DEXTROAMPHET ER 25 MG PO CP24: ORAL_CAPSULE | ORAL | Status: DC

## 2011-12-06 NOTE — Telephone Encounter (Signed)
Pt notified and rx put upfront for pickup

## 2011-12-06 NOTE — Telephone Encounter (Signed)
Printed for WP to sign. 

## 2011-12-06 NOTE — Telephone Encounter (Signed)
Pt needs new rx generic adderall xr 25 mg °

## 2011-12-06 NOTE — Telephone Encounter (Signed)
Left message for the pt to return my call.  Need to know if he wants 90 day rx, 3 30 days, or 1 month.

## 2012-01-19 ENCOUNTER — Ambulatory Visit: Payer: BC Managed Care – PPO | Admitting: Internal Medicine

## 2012-01-19 DIAGNOSIS — Z0289 Encounter for other administrative examinations: Secondary | ICD-10-CM

## 2012-03-21 ENCOUNTER — Telehealth: Payer: Self-pay | Admitting: Internal Medicine

## 2012-03-21 NOTE — Telephone Encounter (Signed)
Pt needs refill of amphetamine-dextroamphetamine (ADDERALL XR) 25 MG 24 hr capsule. Thank you!!

## 2012-03-21 NOTE — Telephone Encounter (Signed)
The pt was last seen on 07/21/11 and should have returned in Oct.  He no showed for his appt on 01/19/12 and has not rescheduled.  Please advise refills.  Thanks!!!

## 2012-03-21 NOTE — Telephone Encounter (Signed)
Contact him about  This  Ok to refill until can get in to the office x 1 month.   He is usually good about appts.

## 2012-03-22 ENCOUNTER — Other Ambulatory Visit: Payer: Self-pay | Admitting: Family Medicine

## 2012-03-22 MED ORDER — AMPHETAMINE-DEXTROAMPHET ER 25 MG PO CP24: ORAL_CAPSULE | ORAL | Status: DC

## 2012-03-22 NOTE — Telephone Encounter (Signed)
Called the pt and left message on personally identified voicemail that his rx is ready for pick up and that he will need to make an appt for further refills.

## 2012-04-09 ENCOUNTER — Encounter: Payer: Self-pay | Admitting: Internal Medicine

## 2012-04-09 ENCOUNTER — Ambulatory Visit (INDEPENDENT_AMBULATORY_CARE_PROVIDER_SITE_OTHER): Payer: BC Managed Care – PPO | Admitting: Internal Medicine

## 2012-04-09 VITALS — BP 116/82 | HR 97 | Temp 97.8°F | Wt 174.0 lb

## 2012-04-09 DIAGNOSIS — Z79899 Other long term (current) drug therapy: Secondary | ICD-10-CM

## 2012-04-09 DIAGNOSIS — Z23 Encounter for immunization: Secondary | ICD-10-CM

## 2012-04-09 DIAGNOSIS — F988 Other specified behavioral and emotional disorders with onset usually occurring in childhood and adolescence: Secondary | ICD-10-CM

## 2012-04-09 MED ORDER — AMPHETAMINE-DEXTROAMPHET ER 25 MG PO CP24: ORAL_CAPSULE | ORAL | Status: DC

## 2012-04-09 NOTE — Patient Instructions (Signed)
Continue lifestyle intervention healthy eating and exercise .  ROV med check in 6 months  Or as needed. Flu vaccine today

## 2012-04-09 NOTE — Progress Notes (Signed)
Chief Complaint  Patient presents with  . Follow-up    Meds    HPI:  No significant side effects such as major sleep issues and mood changes, chest pain, shortness of breath, headaches , GI or significant weight loss.  Taking med  5 days per week and some on weekend .   40- + hours .   astma stable no gi issues   ROS: See pertinent positives and negatives per HPI.no sob asthma flair.   Past Medical History  Diagnosis Date  . ADD (attention deficit disorder)   . IBS (irritable bowel syndrome)   . Allergy   . Asthma     hx consult Dr Maple Hudson.    Family History  Problem Relation Age of Onset  . Hyperlipidemia Father     History   Social History  . Marital Status: Single    Spouse Name: N/A    Number of Children: N/A  . Years of Education: N/A   Social History Main Topics  . Smoking status: Never Smoker   . Smokeless tobacco: None  . Alcohol Use: Yes     Comment: socially  . Drug Use: No  . Sexually Active: None   Other Topics Concern  . 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 No petsRegular exercise- yes runner 2 x per week  Lift 2 x per week No ets.     Outpatient Encounter Prescriptions as of 04/09/2012  Medication Sig Dispense Refill  . albuterol (PROAIR HFA) 108 (90 BASE) MCG/ACT inhaler Inhale 2 puffs into the lungs every 6 (six) hours as needed.        Marland Kitchen amphetamine-dextroamphetamine (ADDERALL XR) 25 MG 24 hr capsule Take 1 capsule daily  30 capsule  0  . amphetamine-dextroamphetamine (ADDERALL XR) 25 MG 24 hr capsule Take one capsule daily.  30 capsule  0  . amphetamine-dextroamphetamine (ADDERALL XR) 25 MG 24 hr capsule Take one capsule daily  30 capsule  0  . loratadine (CLARITIN) 10 MG tablet Take 10 mg by mouth daily.        . [DISCONTINUED] amphetamine-dextroamphetamine (ADDERALL XR) 25 MG 24 hr capsule Take 1 capsule daily  30 capsule  0  .  [DISCONTINUED] amphetamine-dextroamphetamine (ADDERALL XR) 25 MG 24 hr capsule Take one capsule daily.  30 capsule  0  . [DISCONTINUED] amphetamine-dextroamphetamine (ADDERALL XR) 25 MG 24 hr capsule Take one capsule daily  30 capsule  0  . amphetamine-dextroamphetamine (ADDERALL XR, 25MG ,) 25 MG 24 hr capsule Take 1 capsule (25 mg total) by mouth every morning.  30 capsule  0    EXAM:  BP 116/82  Pulse 97  Temp 97.8 F (36.6 C) (Oral)  Wt 174 lb (78.926 kg)  SpO2 98%  There is no height on file to calculate BMI.  GENERAL: vitals reviewed and listed above, alert, oriented, appears well hydrated and in no acute distress  HEENT: atraumatic, conjunctiva  clear, no obvious abnormalities on inspection of external nose and ears OP : no lesion edema or exudate   NECK: no obvious masses on inspection palpation   LUNGS: clear to auscultation bilaterally, no wheezes, rales or rhonchi, good air movement  CV: HRRR, no clubbing cyanosis or  peripheral edema nl cap refill   MS: moves all extremities without noticeable focal  abnormality  PSYCH: pleasant and cooperative, no obvious depression  or anxiety  ASSESSMENT AND PLAN:  Discussed the following assessment and plan:  1. ADD   2. Medication management   continue same regimen at this time   Refill med  Wellness visit in 6 months ( needs for job)   No labs needed at this time  Disc flu vaccine  Give today  -Patient advised to return or notify health care team   new concerns arise.   Patient Instructions  Continue lifestyle intervention healthy eating and exercise .  ROV med check in 6 months  Or as needed. Flu vaccine today    Neta Mends. Panosh M.D.

## 2012-07-16 ENCOUNTER — Other Ambulatory Visit: Payer: Self-pay | Admitting: Dermatology

## 2012-08-05 ENCOUNTER — Telehealth: Payer: Self-pay | Admitting: Internal Medicine

## 2012-08-05 NOTE — Telephone Encounter (Signed)
Pt is calling to request a 3 month refill of his amphetamine-dextroamphetamine (ADDERALL XR) 25 MG 24 hr capsule. Please assist.

## 2012-08-07 ENCOUNTER — Other Ambulatory Visit: Payer: Self-pay | Admitting: Family Medicine

## 2012-08-07 MED ORDER — AMPHETAMINE-DEXTROAMPHET ER 25 MG PO CP24: ORAL_CAPSULE | ORAL | Status: DC

## 2012-08-07 NOTE — Telephone Encounter (Signed)
Left message on personally identified home number for the pt to pick up rx at the front desk.

## 2012-10-11 ENCOUNTER — Encounter: Payer: BC Managed Care – PPO | Admitting: Internal Medicine

## 2012-11-15 ENCOUNTER — Telehealth: Payer: Self-pay | Admitting: Internal Medicine

## 2012-11-15 NOTE — Telephone Encounter (Signed)
PT called to request a 3 month supply of his amphetamine-dextroamphetamine (ADDERALL XR) 25 MG 24 hr capsule. Please assist.

## 2012-11-15 NOTE — Telephone Encounter (Signed)
This patient is past due for his ADD follow up.  He will have to make an appt to get a refill until he is seen.  Please contact the pt and make an appt.  Thanks!!

## 2012-11-18 ENCOUNTER — Encounter: Payer: Self-pay | Admitting: Internal Medicine

## 2012-11-18 ENCOUNTER — Ambulatory Visit (INDEPENDENT_AMBULATORY_CARE_PROVIDER_SITE_OTHER): Payer: BC Managed Care – PPO | Admitting: Internal Medicine

## 2012-11-18 VITALS — BP 122/70 | HR 91 | Temp 98.2°F | Wt 178.0 lb

## 2012-11-18 DIAGNOSIS — F988 Other specified behavioral and emotional disorders with onset usually occurring in childhood and adolescence: Secondary | ICD-10-CM

## 2012-11-18 DIAGNOSIS — Z79899 Other long term (current) drug therapy: Secondary | ICD-10-CM | POA: Insufficient documentation

## 2012-11-18 MED ORDER — AMPHETAMINE-DEXTROAMPHET ER 25 MG PO CP24: ORAL_CAPSULE | ORAL | Status: DC

## 2012-11-18 NOTE — Patient Instructions (Addendum)
Continue lifestyle intervention; healthy eating and exercise .  Call for medication refill 3 months  We can do 90 days in one rx if acceptable to insurance etc.   Ov in 6 months or as needed.  Sign up for my chart  .

## 2012-11-18 NOTE — Progress Notes (Signed)
Chief Complaint  Patient presents with  . Follow-up    HPI: No major change in health status since last visit .  FU add and meds : No significant side effects such as major sleep issues and mood changes, chest pain, shortness of breath, headaches , GI or significant weight loss. Takes all work days  And other days depending on  Tasks needed Med Very helpful at work     If runs out [employer].    No  [place].  Asthma.  allergies   Bad in spring.  No inhalers. Use  Sleep :  8 hours. No tobacco  etoh few times per week  ROS: See pertinent positives and negatives per HPI.  Past Medical History  Diagnosis Date  . ADD (attention deficit disorder)   . IBS (irritable bowel syndrome)   . Allergy   . Asthma     hx consult Dr Maple Hudson.    Family History  Problem Relation Age of Onset  . Hyperlipidemia Father     History   Social History  . Marital Status: Single    Spouse Name: N/A    Number of Children: N/A  . Years of Education: N/A   Social History Main Topics  . Smoking status: Never Smoker   . Smokeless tobacco: None  . Alcohol Use: Yes     Comment: socially  . Drug Use: No  . Sexual Activity: None   Other Topics Concern  . 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    No pets   Regular exercise- yes runner 2 x per week  Lift 2 x per week    No ets.                 Outpatient Encounter Prescriptions as of 11/18/2012  Medication Sig Dispense Refill  . albuterol (PROAIR HFA) 108 (90 BASE) MCG/ACT inhaler Inhale 2 puffs into the lungs every 6 (six) hours as needed.        Marland Kitchen amphetamine-dextroamphetamine (ADDERALL XR) 25 MG 24 hr capsule Take one capsule daily  30 capsule  0  . amphetamine-dextroamphetamine (ADDERALL XR) 25 MG 24 hr capsule Take one capsule daily.  30 capsule  0  . amphetamine-dextroamphetamine (ADDERALL XR) 25 MG 24 hr  capsule Take 1 capsule daily  30 capsule  0  . loratadine (CLARITIN) 10 MG tablet Take 10 mg by mouth daily.        . [DISCONTINUED] amphetamine-dextroamphetamine (ADDERALL XR) 25 MG 24 hr capsule Take 1 capsule daily  30 capsule  0  . [DISCONTINUED] amphetamine-dextroamphetamine (ADDERALL XR) 25 MG 24 hr capsule Take one capsule daily.  30 capsule  0  . [DISCONTINUED] amphetamine-dextroamphetamine (ADDERALL XR) 25 MG 24 hr capsule Take one capsule daily  30 capsule  0  . amphetamine-dextroamphetamine (ADDERALL XR, 25MG ,) 25 MG 24 hr capsule Take 1 capsule (25 mg total) by mouth every morning.  30 capsule  0   No facility-administered encounter medications on file as of 11/18/2012.    EXAM:  BP 122/70  Pulse 91  Temp(Src) 98.2 F (36.8 C) (Oral)  Wt 178 lb (80.74 kg)  BMI 23.49 kg/m2  SpO2 99%  Body mass index is 23.49 kg/(m^2).  GENERAL: vitals reviewed and listed above, alert, oriented, appears well hydrated and in no acute distress  HEENT: atraumatic, conjunctiva  clear, no obvious abnormalities on inspection of external nose and ears NECK: no obvious masses on inspection palpation LUNGS: clear to auscultation bilaterally, no wheezes, rales or rhonchi, good air movement CV: HRRR, no clubbing cyanosis or  peripheral edema nl cap refill  abd no organomegaly MS: moves all extremities without noticeable focal  abnormality PSYCH: pleasant and cooperative, no obvious depression or anxiety Neuro non focal  ASSESSMENT AND PLAN:  Discussed the following assessment and plan:  ADD - stable with help on current regimen.  no sig se  Medication management Continue benefit more than risk reviewed. Last labs   Minor elevation of alt  May repeat no sig risk  .   Refill med for 90 days  Adequate response to med . Side effect ;decrease appetite ,acceptable .Continue at same dose . ROV in 3- 6 months for med check.   -Patient advised to return or notify health care team  if symptoms worsen  or persist or new concerns arise.  Patient Instructions  Continue lifestyle intervention; healthy eating and exercise .  Call for medication refill 3 months  We can do 90 days in one rx if acceptable to insurance etc.   Ov in 6 months or as needed.  Sign up for my chart  .       Neta Mends. Ami Mally M.D.

## 2013-03-03 ENCOUNTER — Encounter: Payer: Self-pay | Admitting: Internal Medicine

## 2013-03-03 ENCOUNTER — Other Ambulatory Visit: Payer: Self-pay | Admitting: Family Medicine

## 2013-03-03 ENCOUNTER — Telehealth: Payer: Self-pay | Admitting: Internal Medicine

## 2013-03-03 MED ORDER — AMPHETAMINE-DEXTROAMPHET ER 25 MG PO CP24: ORAL_CAPSULE | ORAL | Status: DC

## 2013-03-03 NOTE — Telephone Encounter (Signed)
Pt needs new rxs generic adderall xr 25 mg. Pt needs rxs for next 3 months

## 2013-03-03 NOTE — Telephone Encounter (Signed)
Patient notified to pick up rx at the front desk.

## 2013-05-22 ENCOUNTER — Ambulatory Visit: Payer: BC Managed Care – PPO | Admitting: Internal Medicine

## 2013-06-05 ENCOUNTER — Ambulatory Visit (INDEPENDENT_AMBULATORY_CARE_PROVIDER_SITE_OTHER): Payer: BC Managed Care – PPO | Admitting: Internal Medicine

## 2013-06-05 ENCOUNTER — Encounter: Payer: Self-pay | Admitting: Internal Medicine

## 2013-06-05 VITALS — BP 124/80 | Temp 97.9°F | Ht 69.5 in | Wt 181.0 lb

## 2013-06-05 DIAGNOSIS — F988 Other specified behavioral and emotional disorders with onset usually occurring in childhood and adolescence: Secondary | ICD-10-CM

## 2013-06-05 DIAGNOSIS — Z79899 Other long term (current) drug therapy: Secondary | ICD-10-CM

## 2013-06-05 MED ORDER — AMPHETAMINE-DEXTROAMPHET ER 25 MG PO CP24: ORAL_CAPSULE | ORAL | Status: DC

## 2013-06-05 NOTE — Progress Notes (Signed)
Chief Complaint  Patient presents with  . Follow-up    HPI: Jonathan [name] here for fu ADD and medication management  No major change in health status since last visit . Plays tennis .Add  Week days and weekends am  Keeps focused  Dec wandering . Mostly mon throught Friday  Not on weekends   Off taks wandering.  Sleep ok.  ROS: See pertinent positives and negatives per HPI. Neg cv resp orhto at this time  Past Medical History  Diagnosis Date  . ADD (attention deficit disorder)   . IBS (irritable bowel syndrome)   . Allergy   . Asthma     hx consult Dr Annamaria Boots.    Family History  Problem Relation Age of Onset  . Hyperlipidemia Father     History   Social History  . Marital Status: Single    Spouse Name: N/A    Number of Children: N/A  . Years of Education: N/A   Social History Main Topics  . Smoking status: Never Smoker   . Smokeless tobacco: None  . Alcohol Use: Yes     Comment: socially  . Drug Use: No  . Sexual Activity: None   Other Topics Concern  . 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    No pets   Regular exercise- yes runner 2 x per week  Lift 2 x per week    No ets.                 Outpatient Encounter Prescriptions as of 06/05/2013  Medication Sig  . albuterol (PROAIR HFA) 108 (90 BASE) MCG/ACT inhaler Inhale 2 puffs into the lungs every 6 (six) hours as needed.    Marland Kitchen amphetamine-dextroamphetamine (ADDERALL XR) 25 MG 24 hr capsule Take one capsule daily  . amphetamine-dextroamphetamine (ADDERALL XR) 25 MG 24 hr capsule Take one capsule daily.  Marland Kitchen amphetamine-dextroamphetamine (ADDERALL XR) 25 MG 24 hr capsule Take 1 capsule daily  . loratadine (CLARITIN) 10 MG tablet Take 10 mg by mouth daily.    . [DISCONTINUED] amphetamine-dextroamphetamine (ADDERALL XR) 25 MG 24 hr capsule Take one capsule daily  . [DISCONTINUED]  amphetamine-dextroamphetamine (ADDERALL XR) 25 MG 24 hr capsule Take one capsule daily.  . [DISCONTINUED] amphetamine-dextroamphetamine (ADDERALL XR) 25 MG 24 hr capsule Take 1 capsule daily  . amphetamine-dextroamphetamine (ADDERALL XR, 25MG ,) 25 MG 24 hr capsule Take 1 capsule (25 mg total) by mouth every morning.    EXAM:  BP 124/80  Temp(Src) 97.9 F (36.6 C) (Oral)  Ht 5' 9.5" (1.765 m)  Wt 181 lb (82.101 kg)  BMI 26.35 kg/m2  Body mass index is 26.35 kg/(m^2).  GENERAL: vitals reviewed and listed above, alert, oriented, appears well hydrated and in no acute distress HEENT: atraumatic, conjunctiva  clear, no obvious abnormalities on inspection of external nose and ears OP : no lesion edema or exudate  NECK: no obvious masses on inspection palpation  LUNGS: clear to auscultation bilaterally, no wheezes, rales or rhonchi, good air movement CV: HRRR, no clubbing cyanosis or  peripheral edema nl cap refill  MS: moves all extremities without noticeable focal  abnormality PSYCH: pleasant and cooperative, no obvious depression or anxiety Lab Results  Component Value Date   WBC 4.6 07/21/2011   HGB 15.0 07/21/2011   HCT 43.4  07/21/2011   PLT 200.0 07/21/2011   GLUCOSE 72 07/21/2011   CHOL 198 07/21/2011   TRIG 58.0 07/21/2011   HDL 56.70 07/21/2011   LDLCALC 130* 07/21/2011   ALT 47 07/21/2011   AST 39* 07/21/2011   NA 139 07/21/2011   K 3.9 07/21/2011   CL 104 07/21/2011   CREATININE 1.0 07/21/2011   BUN 18 07/21/2011   CO2 26 07/21/2011   TSH 1.34 07/21/2011    ASSESSMENT AND PLAN:  Discussed the following assessment and plan:  ADD  Medication management - unable to get tox screen dec and today will return for rx when can give sample  Pt said she can -Patient advised to return or notify health care team  if symptoms worsen ,persist or new concerns arise.  Patient Instructions   Get [place] copy of labs done through work.   Continue lifestyle intervention healthy eating and exercise  . Continue  meds ROV in 6 months for  medication check .   Wt Readings from Last 3 Encounters:  06/05/13 181 lb (82.101 kg)  11/18/12 178 lb (80.74 kg)  04/09/12 174 lb (78.926 kg)   Lab Results  Component Value Date   WBC 4.6 07/21/2011   HGB 15.0 07/21/2011   HCT 43.4 07/21/2011   PLT 200.0 07/21/2011   GLUCOSE 72 07/21/2011   CHOL 198 07/21/2011   TRIG 58.0 07/21/2011   HDL 56.70 07/21/2011   LDLCALC 130* 07/21/2011   ALT 47 07/21/2011   AST 39* 07/21/2011   NA 139 07/21/2011   K 3.9 07/21/2011   CL 104 07/21/2011   CREATININE 1.0 07/21/2011   BUN 18 07/21/2011   CO2 26 07/21/2011   TSH 1.34 07/21/2011     Mariann Laster K. Adaliz Dobis M.D.   Pre visit review using our clinic review tool, if applicable. No additional management support is needed unless otherwise documented below in the visit note.

## 2013-06-05 NOTE — Patient Instructions (Addendum)
Get [place] copy of labs done through work.   Continue lifestyle intervention healthy eating and exercise . Continue  meds ROV in 6 months for  medication check .   Wt Readings from Last 3 Encounters:  06/05/13 181 lb (82.101 kg)  11/18/12 178 lb (80.74 kg)  04/09/12 174 lb (78.926 kg)   Lab Results  Component Value Date   WBC 4.6 07/21/2011   HGB 15.0 07/21/2011   HCT 43.4 07/21/2011   PLT 200.0 07/21/2011   GLUCOSE 72 07/21/2011   CHOL 198 07/21/2011   TRIG 58.0 07/21/2011   HDL 56.70 07/21/2011   LDLCALC 130* 07/21/2011   ALT 47 07/21/2011   AST 39* 07/21/2011   NA 139 07/21/2011   K 3.9 07/21/2011   CL 104 07/21/2011   CREATININE 1.0 07/21/2011   BUN 18 07/21/2011   CO2 26 07/21/2011   TSH 1.34 07/21/2011

## 2013-07-02 ENCOUNTER — Encounter: Payer: Self-pay | Admitting: Internal Medicine

## 2013-07-30 ENCOUNTER — Other Ambulatory Visit: Payer: Self-pay | Admitting: Dermatology

## 2013-09-22 ENCOUNTER — Telehealth: Payer: Self-pay | Admitting: Internal Medicine

## 2013-09-22 NOTE — Telephone Encounter (Signed)
Pt is needing new rx amphetamine-dextroamphetamine (ADDERALL XR) 25 MG 24 hr capsule Please call when available for pick up.

## 2013-09-24 MED ORDER — AMPHETAMINE-DEXTROAMPHET ER 25 MG PO CP24: ORAL_CAPSULE | ORAL | Status: DC

## 2013-09-24 NOTE — Telephone Encounter (Signed)
Patient notified prescriptions would be placed at the front desk for pick up.

## 2013-12-05 ENCOUNTER — Ambulatory Visit (INDEPENDENT_AMBULATORY_CARE_PROVIDER_SITE_OTHER): Payer: BC Managed Care – PPO | Admitting: Internal Medicine

## 2013-12-05 ENCOUNTER — Encounter: Payer: Self-pay | Admitting: Internal Medicine

## 2013-12-05 VITALS — BP 132/78 | Temp 98.5°F | Ht 69.5 in | Wt 162.0 lb

## 2013-12-05 DIAGNOSIS — Z79899 Other long term (current) drug therapy: Secondary | ICD-10-CM

## 2013-12-05 DIAGNOSIS — M25531 Pain in right wrist: Secondary | ICD-10-CM

## 2013-12-05 DIAGNOSIS — M25539 Pain in unspecified wrist: Secondary | ICD-10-CM

## 2013-12-05 DIAGNOSIS — Z23 Encounter for immunization: Secondary | ICD-10-CM

## 2013-12-05 DIAGNOSIS — F988 Other specified behavioral and emotional disorders with onset usually occurring in childhood and adolescence: Secondary | ICD-10-CM

## 2013-12-05 MED ORDER — MELOXICAM 15 MG PO TABS: 15.0000 mg | ORAL_TABLET | Freq: Every day | ORAL | Status: DC

## 2013-12-05 MED ORDER — AMPHETAMINE-DEXTROAMPHET ER 25 MG PO CP24
ORAL_CAPSULE | ORAL | Status: DC
Start: 2013-12-05 — End: 2014-03-23

## 2013-12-05 MED ORDER — AMPHETAMINE-DEXTROAMPHET ER 25 MG PO CP24: ORAL_CAPSULE | ORAL | Status: DC

## 2013-12-05 NOTE — Progress Notes (Signed)
Pre visit review using our clinic review tool, if applicable. No additional management support is needed unless otherwise documented below in the visit note.  Chief Complaint  Patient presents with  . Wrist Pain    Wrist pain started 2 wks ago.  Tried to play tennis over the weekend but was unable.  Also has foot pain.    HPI: Patient CASPIAN DELEONARDIS  comes in today for SDA for  new problem evaluation. Onset  2 weeks ago had match of tennis .  And began hurting  When hitting and then rested and  Sx when pushing  Off and then  This past weekend  Hurt so bad couldn't hold on to racket .Marland Kitchen   Tried brace some help   After  Pain on flexion and extension and now more constant   Pain.  With active motion and dec grip.   No falling recently  Mild swelling  No pain elbow or forearm.  Using ice tried soft wrap .  No numbness  ? If weaker or not. Hard to shake a hand . Remote hx of  Injury middle school playing b ball  On fell in hands  And hurts  Wore a brace for a while  Since then ocass pain on pushing off.     ROS: See pertinent positives and negatives per HPI. Left foot .  Medial  Arch  ADHD : due for med check taking most day still helpful without  Sig se cv pulm gi etc .  Due for refills   Past Medical History  Diagnosis Date  . ADD (attention deficit disorder)   . IBS (irritable bowel syndrome)   . Allergy   . Asthma     hx consult Dr Annamaria Boots.    Family History  Problem Relation Age of Onset  . Hyperlipidemia Father     History   Social History  . Marital Status: Single    Spouse Name: N/A    Number of Children: N/A  . Years of Education: N/A   Social History Main Topics  . Smoking status: Never Smoker   . Smokeless tobacco: None  . Alcohol Use: Yes     Comment: socially  . Drug Use: No  . Sexual Activity: None   Other Topics Concern  . 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    No pets   Regular exercise- yes runner 2 x per week  Lift 2 x per week    No ets.                 Outpatient Encounter Prescriptions as of 12/05/2013  Medication Sig  . amphetamine-dextroamphetamine (ADDERALL XR) 25 MG 24 hr capsule Take one capsule daily  . amphetamine-dextroamphetamine (ADDERALL XR) 25 MG 24 hr capsule Take one capsule daily.  Marland Kitchen amphetamine-dextroamphetamine (ADDERALL XR) 25 MG 24 hr capsule Take 1 capsule daily  . loratadine (CLARITIN) 10 MG tablet Take 10 mg by mouth daily.    . [DISCONTINUED] amphetamine-dextroamphetamine (ADDERALL XR) 25 MG 24 hr capsule Take one capsule daily  . [DISCONTINUED] amphetamine-dextroamphetamine (ADDERALL XR) 25 MG 24 hr capsule Take one capsule daily.  . [DISCONTINUED] amphetamine-dextroamphetamine (ADDERALL XR) 25 MG 24 hr capsule Take 1 capsule daily  . albuterol (PROAIR HFA) 108 (90 BASE) MCG/ACT inhaler Inhale 2 puffs  into the lungs every 6 (six) hours as needed.    Marland Kitchen amphetamine-dextroamphetamine (ADDERALL XR, 25MG ,) 25 MG 24 hr capsule Take 1 capsule (25 mg total) by mouth every morning.  . meloxicam (MOBIC) 15 MG tablet Take 1 tablet (15 mg total) by mouth daily. For 1-2 weeks then as directed    EXAM:  BP 132/78  Temp(Src) 98.5 F (36.9 C) (Oral)  Ht 5' 9.5" (1.765 m)  Wt 162 lb (73.483 kg)  BMI 23.59 kg/m2  Body mass index is 23.59 kg/(m^2).  GENERAL: vitals reviewed and listed above, alert, oriented, appears well hydrated and in no acute distress HEENT: atraumatic, conjunctiva  clear, no obvious abnormalities on inspection of external nose and ears  NECK: no obvious masses on inspection palpation  LUNGS: clear to auscultation bilaterally, no wheezes, rales or rhonchi, good air movement CV: HRRR, no clubbing cyanosis or  peripheral edema nl cap refill  MS:    Right wrist  prominent ? Slight swelling no warmth tender mid wrist and medial  Neg specific snuffbox  No crepitus  Pain with rom  . Grip  No atrophy NV seems intact  Left foot  Nl by inspection no redness swelling gail normal  Some high arching  PSYCH: pleasant and cooperative, no obvious depression or anxiety ASSESSMENT AND PLAN:  Discussed the following assessment and plan:  Right wrist pain - newonset tennis active more than passive  painful hold nad shake hands  refer ortho sports med nsaid rel rest for now  - Plan: Ambulatory referral to Hand Surgery  Attention deficit disorder without mention of hyperactivity - benefot more than risk of medication and refill today   Need for prophylactic vaccination and inoculation against influenza - Plan: Flu Vaccine QUAD 36+ mos PF IM (Fluarix Quad PF)  Medication management  -Patient advised to return or notify health care team  if symptoms worsen ,persist or new concerns arise.  Patient Instructions  This sounds like  A bad wrist tendinitis but the severity  Is more that usual and  Because  You play  Tennis.   Can use  Soft and rigid splinting in the short run.    Trial antiinflammaotry for the  week with food . Will plan referral to ortho sm for  Further advice rehab.    May need x ray of  Wrist .  Ok to continue  Medication as planned . Wellness visit and med check in 6 months  Or as needed        Standley Brooking. Sameka Bagent M.D.

## 2013-12-05 NOTE — Patient Instructions (Addendum)
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   Can use  Soft and rigid splinting in the short run.    Trial antiinflammaotry for the  week with food . Will plan referral to ortho sm for  Further advice rehab.    May need x ray of  Wrist .  Ok to continue  Medication as planned . Wellness visit and med check in 6 months  Or as needed

## 2014-03-23 ENCOUNTER — Telehealth: Payer: Self-pay | Admitting: Internal Medicine

## 2014-03-23 MED ORDER — AMPHETAMINE-DEXTROAMPHET ER 25 MG PO CP24: ORAL_CAPSULE | ORAL | Status: DC

## 2014-03-23 NOTE — Telephone Encounter (Signed)
° ° °

## 2014-03-23 NOTE — Telephone Encounter (Signed)
Pt notified to pick up at the front desk.  Will keep CPE appt in March 2016

## 2014-05-29 ENCOUNTER — Other Ambulatory Visit: Payer: Self-pay

## 2014-06-01 ENCOUNTER — Other Ambulatory Visit (INDEPENDENT_AMBULATORY_CARE_PROVIDER_SITE_OTHER): Payer: BLUE CROSS/BLUE SHIELD

## 2014-06-01 DIAGNOSIS — Z0000 Encounter for general adult medical examination without abnormal findings: Secondary | ICD-10-CM

## 2014-06-01 LAB — CBC WITH DIFFERENTIAL/PLATELET
BASOS PCT: 0.4 % (ref 0.0–3.0)
Basophils Absolute: 0 10*3/uL (ref 0.0–0.1)
EOS PCT: 2.4 % (ref 0.0–5.0)
Eosinophils Absolute: 0.2 10*3/uL (ref 0.0–0.7)
HCT: 43.9 % (ref 39.0–52.0)
Hemoglobin: 15.3 g/dL (ref 13.0–17.0)
Lymphocytes Relative: 25 % (ref 12.0–46.0)
Lymphs Abs: 1.7 10*3/uL (ref 0.7–4.0)
MCHC: 34.8 g/dL (ref 30.0–36.0)
MCV: 91.1 fl (ref 78.0–100.0)
MONO ABS: 0.6 10*3/uL (ref 0.1–1.0)
Monocytes Relative: 9.4 % (ref 3.0–12.0)
NEUTROS ABS: 4.2 10*3/uL (ref 1.4–7.7)
Neutrophils Relative %: 62.8 % (ref 43.0–77.0)
Platelets: 234 10*3/uL (ref 150.0–400.0)
RBC: 4.82 Mil/uL (ref 4.22–5.81)
RDW: 13.1 % (ref 11.5–15.5)
WBC: 6.7 10*3/uL (ref 4.0–10.5)

## 2014-06-01 LAB — COMPREHENSIVE METABOLIC PANEL
ALBUMIN: 4.6 g/dL (ref 3.5–5.2)
ALT: 39 U/L (ref 0–53)
AST: 31 U/L (ref 0–37)
Alkaline Phosphatase: 86 U/L (ref 39–117)
BILIRUBIN TOTAL: 0.6 mg/dL (ref 0.2–1.2)
BUN: 17 mg/dL (ref 6–23)
CHLORIDE: 97 meq/L (ref 96–112)
CO2: 29 mEq/L (ref 19–32)
Calcium: 9.7 mg/dL (ref 8.4–10.5)
Creatinine, Ser: 0.96 mg/dL (ref 0.40–1.50)
GFR: 94.87 mL/min (ref >60.00)
GLUCOSE: 96 mg/dL (ref 70–99)
Potassium: 4.4 mEq/L (ref 3.5–5.1)
Sodium: 132 mEq/L — ABNORMAL LOW (ref 135–145)
Total Protein: 7.5 g/dL (ref 6.0–8.3)

## 2014-06-01 LAB — LIPID PANEL
CHOL/HDL RATIO: 3
Cholesterol: 203 mg/dL — ABNORMAL HIGH (ref 0–200)
HDL: 58.8 mg/dL (ref >39.00)
LDL CALC: 111 mg/dL — AB (ref 0–99)
NonHDL: 144.2
TRIGLYCERIDES: 168 mg/dL — AB (ref 0.0–149.0)
VLDL: 33.6 mg/dL (ref 0.0–40.0)

## 2014-06-01 LAB — TSH: TSH: 1.77 u[IU]/mL (ref 0.35–4.50)

## 2014-06-05 ENCOUNTER — Ambulatory Visit (INDEPENDENT_AMBULATORY_CARE_PROVIDER_SITE_OTHER): Payer: BLUE CROSS/BLUE SHIELD | Admitting: Internal Medicine

## 2014-06-05 ENCOUNTER — Encounter: Payer: Self-pay | Admitting: Internal Medicine

## 2014-06-05 VITALS — BP 120/78 | Temp 97.6°F | Ht 69.25 in | Wt 172.0 lb

## 2014-06-05 DIAGNOSIS — E785 Hyperlipidemia, unspecified: Secondary | ICD-10-CM

## 2014-06-05 DIAGNOSIS — Z79899 Other long term (current) drug therapy: Secondary | ICD-10-CM

## 2014-06-05 DIAGNOSIS — Z0000 Encounter for general adult medical examination without abnormal findings: Secondary | ICD-10-CM

## 2014-06-05 DIAGNOSIS — F909 Attention-deficit hyperactivity disorder, unspecified type: Secondary | ICD-10-CM

## 2014-06-05 DIAGNOSIS — F988 Other specified behavioral and emotional disorders with onset usually occurring in childhood and adolescence: Secondary | ICD-10-CM

## 2014-06-05 MED ORDER — AMPHETAMINE-DEXTROAMPHET ER 25 MG PO CP24: ORAL_CAPSULE | ORAL | Status: DC

## 2014-06-05 NOTE — Patient Instructions (Signed)
Continue lifestyle intervention healthy eating and exercise . To help cholesterol and health  Consider adding yoga type exercise with your aerobic to help with jaw clenching       Why follow it? Research shows. . Those who follow the Mediterranean diet have a reduced risk of heart disease  . The diet is associated with a reduced incidence of Parkinson's and Alzheimer's diseases . People following the diet may have longer life expectancies and lower rates of chronic diseases  . The Dietary Guidelines for Americans recommends the Mediterranean diet as an eating plan to promote health and prevent disease  What Is the Mediterranean Diet?  . Healthy eating plan based on typical foods and recipes of Mediterranean-style cooking . The diet is primarily a plant based diet; these foods should make up a majority of meals   Starches - Plant based foods should make up a majority of meals - They are an important sources of vitamins, minerals, energy, antioxidants, and fiber - Choose whole grains, foods high in fiber and minimally processed items  - Typical grain sources include wheat, oats, barley, corn, brown rice, bulgar, farro, millet, polenta, couscous  - Various types of beans include chickpeas, lentils, fava beans, black beans, white beans   Fruits  Veggies - Large quantities of antioxidant rich fruits & veggies; 6 or more servings  - Vegetables can be eaten raw or lightly drizzled with oil and cooked  - Vegetables common to the traditional Mediterranean Diet include: artichokes, arugula, beets, broccoli, brussel sprouts, cabbage, carrots, celery, collard greens, cucumbers, eggplant, kale, leeks, lemons, lettuce, mushrooms, okra, onions, peas, peppers, potatoes, pumpkin, radishes, rutabaga, shallots, spinach, sweet potatoes, turnips, zucchini - Fruits common to the Mediterranean Diet include: apples, apricots, avocados, cherries, clementines, dates, figs, grapefruits, grapes, melons, nectarines,  oranges, peaches, pears, pomegranates, strawberries, tangerines  Fats - Replace butter and margarine with healthy oils, such as olive oil, canola oil, and tahini  - Limit nuts to no more than a handful a day  - Nuts include walnuts, almonds, pecans, pistachios, pine nuts  - Limit or avoid candied, honey roasted or heavily salted nuts - Olives are central to the [employer] - can be eaten whole or used in a variety of dishes   Meats Protein - Limiting red meat: no more than a few times a month - When eating red meat: choose lean cuts and keep the portion to the size of deck of cards - Eggs: approx. 0 to 4 times a week  - Fish and lean poultry: at least 2 a week  - Healthy protein sources include, chicken, [place], lean beef, lamb - Increase intake of seafood such as tuna, salmon, trout, mackerel, shrimp, scallops - Avoid or limit high fat processed meats such as sausage and bacon  Dairy - Include moderate amounts of low fat dairy products  - Focus on healthy dairy such as fat free yogurt, skim milk, low or reduced fat cheese - Limit dairy products higher in fat such as whole or 2% milk, cheese, ice cream  Alcohol - Moderate amounts of red wine is ok  - No more than 5 oz daily for women (all ages) and men older than age 50  - No more than 10 oz of wine daily for men younger than 55  Other - Limit sweets and other desserts  - Use herbs and spices instead of salt to flavor foods  - Herbs and spices common to the traditional Mediterranean Diet include: basil, bay leaves,  chives, cloves, cumin, fennel, garlic, lavender, marjoram, mint, oregano, parsley, pepper, rosemary, sage, savory, sumac, tarragon, thyme   It's not just a diet, it's a lifestyle:  . The Mediterranean diet includes lifestyle factors typical of those in the region  . Foods, drinks and meals are best eaten with others and savored . Daily physical activity is important for overall good health . This could be strenuous  exercise like running and aerobics . This could also be more leisurely activities such as walking, housework, yard-work, or taking the stairs . Moderation is the key; a balanced and healthy diet accommodates most foods and drinks . Consider portion sizes and frequency of consumption of certain foods   Meal Ideas & Options:  . Breakfast:  o Whole wheat toast or whole wheat English muffins with peanut butter & hard boiled egg o Steel cut oats topped with apples & cinnamon and skim milk  o Fresh fruit: banana, strawberries, melon, berries, peaches  o Smoothies: strawberries, bananas, greek yogurt, peanut butter o Low fat greek yogurt with blueberries and granola  o Egg white omelet with spinach and mushrooms o Breakfast couscous: whole wheat couscous, apricots, skim milk, cranberries  . Sandwiches:  o Hummus and grilled vegetables (peppers, zucchini, squash) on whole wheat bread   o Grilled chicken on whole wheat pita with lettuce, tomatoes, cucumbers or tzatziki  o Tuna salad on whole wheat bread: tuna salad made with greek yogurt, olives, red peppers, capers, green onions o Garlic rosemary lamb pita: lamb sauted with garlic, rosemary, salt & pepper; add lettuce, cucumber, greek yogurt to pita - flavor with lemon juice and black pepper  . Seafood:  o Mediterranean grilled salmon, seasoned with garlic, basil, parsley, lemon juice and black pepper o Shrimp, lemon, and spinach whole-grain pasta salad made with low fat greek yogurt  o Seared scallops with lemon orzo  o Seared tuna steaks seasoned salt, pepper, coriander topped with tomato mixture of olives, tomatoes, olive oil, minced garlic, parsley, green onions and cappers  . Meats:  o Herbed greek chicken salad with kalamata olives, cucumber, feta  o Red bell peppers stuffed with spinach, bulgur, lean ground beef (or lentils) & topped with feta   o Kebabs: skewers of chicken, tomatoes, onions, zucchini, squash  o [place] burgers: made with  red onions, mint, dill, lemon juice, feta cheese topped with roasted red peppers . Vegetarian o Cucumber salad: cucumbers, artichoke hearts, celery, red onion, feta cheese, tossed in olive oil & lemon juice  o Hummus and whole grain pita points with a greek salad (lettuce, tomato, feta, olives, cucumbers, red onion) o Lentil soup with celery, carrots made with vegetable broth, garlic, salt and pepper  o Tabouli salad: parsley, bulgur, mint, scallions, cucumbers, tomato, radishes, lemon juice, olive oil, salt and pepper.

## 2014-06-05 NOTE — Progress Notes (Signed)
Pre visit review using our clinic review tool, if applicable. No additional management support is needed unless otherwise documented below in the visit note.  Chief Complaint  Patient presents with  . Annual Exam  . ADHD    med check    HPI: Patient  Jonathan [name]  34 y.o. comes in today for [place] visit  Rare to no use of inhaler  Wrist   ortman did surgery about a week ago doing ok   Is right handed les exercise recently  ADHD  All weekeday and depending on weekend  No change and side effects.  Still helps . Tends to grit jaw has bit guard ocass sensitivity behiod ear red no rash  See derm no new abnormal lesions  Health Maintenance  Topic Date Due  . HIV Screening  08/31/1994  . INFLUENZA VACCINE  10/26/2014  . TETANUS/TDAP  07/18/2020   Health Maintenance Review LIFESTYLE:  Exercise:  [employer] camp.  Tobacco/ETS:  No  Alcohol: 1-2 per week  Sugar beverages: hot tea ocass soda.  Sleep: ave 7 hours  Drug use: no  ROS:  GEN/ HEENT: No fever, significant weight changes sweats headaches vision problems hearing changes, CV/ PULM; No chest pain shortness of breath cough, syncope,edema  change in exercise tolerance. GI /GU: No adominal pain, vomiting, change in bowel habits. No blood in the stool. No significant GU symptoms. SKIN/HEME: ,no acute skin rashes suspicious lesions or bleeding. No lymphadenopathy, nodules, masses.  NEURO/ PSYCH:  No neurologic signs such as weakness numbness. No depression anxiety. IMM/ Allergy: No unusual infections.  Allergy .   REST of 12 system review negative except as per HPI   Past Medical History  Diagnosis Date  . ADD (attention deficit disorder)   . IBS (irritable bowel syndrome)   . Allergy   . Asthma     hx consult Dr Annamaria Boots.    Past Surgical History  Procedure Laterality Date  . Upper gastrointestinal endoscopy      eval by GI neg    Family History  Problem Relation Age of Onset  . Hyperlipidemia Father      History   Social History  . Marital Status: Single    Spouse Name: N/A  . Number of Children: N/A  . Years of Education: N/A   Social History Main Topics  . Smoking status: Never Smoker   . Smokeless tobacco: Not on file  . Alcohol Use: Yes     Comment: socially  . Drug Use: No  . Sexual Activity: Not on file   Other Topics Concern  . 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    No pets   Regular exercise- yes runner 2 x per week  Lift 2 x per week    No ets.        Outpatient Encounter Prescriptions as of 06/05/2014  Medication Sig  . amphetamine-dextroamphetamine (ADDERALL XR) 25 MG 24 hr capsule Take one capsule daily  . amphetamine-dextroamphetamine (ADDERALL XR) 25 MG 24 hr capsule Take one capsule daily.  Marland Kitchen amphetamine-dextroamphetamine (ADDERALL XR) 25 MG 24 hr capsule Take 1 capsule daily  . [DISCONTINUED] amphetamine-dextroamphetamine (ADDERALL XR) 25 MG 24 hr capsule Take one capsule daily  . [DISCONTINUED] amphetamine-dextroamphetamine (ADDERALL XR) 25 MG 24 hr capsule Take one capsule daily.  . [  DISCONTINUED] amphetamine-dextroamphetamine (ADDERALL XR) 25 MG 24 hr capsule Take 1 capsule daily  . albuterol (PROAIR HFA) 108 (90 BASE) MCG/ACT inhaler Inhale 2 puffs into the lungs every 6 (six) hours as needed.    . loratadine (CLARITIN) 10 MG tablet Take 10 mg by mouth daily.    . [DISCONTINUED] amphetamine-dextroamphetamine (ADDERALL XR, 25MG ,) 25 MG 24 hr capsule Take 1 capsule (25 mg total) by mouth every morning.  . [DISCONTINUED] meloxicam (MOBIC) 15 MG tablet Take 1 tablet (15 mg total) by mouth daily. For 1-2 weeks then as directed    EXAM:  BP 120/78 mmHg  Temp(Src) 97.6 F (36.4 C) (Oral)  Ht 5' 9.25" (1.759 m)  Wt 172 lb (78.019 kg)  BMI 25.22 kg/m2  Body mass index is 25.22 kg/(m^2).  Physical Exam: Vital signs  reviewed ZYS:AYTK is a well-developed well-nourished alert cooperative    who appearsr stated age in no acute distress.  HEENT: normocephalic atraumatic , Eyes: PERRL EOM's full, conjunctiva clear, Nares: paten,t no deformity discharge or tenderness., Ears: no deformity EAC's clear TMs with normal landmarks. Mouth: clear OP, no lesions, edema.  Moist mucous membranes. Dentition in adequate repair. NECK: supple without masses, thyromegaly or bruits. CHEST/PULM:  Clear to auscultation and percussion breath sounds equal no wheeze , rales or rhonchi. No chest wall deformities or tenderness. CV: PMI is nondisplaced, S1 S2 no gallops, murmurs, rubs. Peripheral pulses are full without delay.No JVD .  ABDOMEN: Bowel sounds normal nontender  No guard or rebound, no hepato splenomegal no CVA tenderness.  No hernia. Extremtities:  No clubbing cyanosis or edema, no acute joint swelling or redness no focal atrophy NEURO:  Oriented x3, cranial nerves 3-12 appear to be intact, no obvious focal weakness,gait within normal limits no abnormal reflexes or asymmetrical SKIN: No acute rashes normal turgor, color, no bruising or petechiae.  Old bx areas no sus lsions PSYCH: Oriented, good eye contact, no obvious depression anxiety, cognition and judgment appear normal. LN: no cervical axillary inguinal adenopathy  Lab Results  Component Value Date   WBC 6.7 06/01/2014   HGB 15.3 06/01/2014   HCT 43.9 06/01/2014   PLT 234.0 06/01/2014   GLUCOSE 96 06/01/2014   CHOL 203* 06/01/2014   TRIG 168.0* 06/01/2014   HDL 58.80 06/01/2014   LDLCALC 111* 06/01/2014   ALT 39 06/01/2014   AST 31 06/01/2014   NA 132* 06/01/2014   K 4.4 06/01/2014   CL 97 06/01/2014   CREATININE 0.96 06/01/2014   BUN 17 06/01/2014   CO2 29 06/01/2014   TSH 1.77 06/01/2014   Labs done after surgery  Ok  ASSESSMENT AND PLAN:  Discussed the following assessment and plan:  Visit for preventive health examination  Hyperlipidemia - tg  up prob from inacitivu from surgery winter etc  pos family hx will intensify lsi  Medication management  Attention deficit disorder - stable benefit more than risk of med  Asthma  Rad a non issue at this time  rov med check 6 months Patient Care Team: Burnis Medin, MD as PCP - Nelson, MD as Attending Physician (Dermatology) Patient Instructions   Continue lifestyle intervention healthy eating and exercise . To help cholesterol and health  Consider adding yoga type exercise with your aerobic to help with jaw clenching       Why follow it? Research shows. . Those who follow the Mediterranean diet have a reduced risk of heart disease  . The diet is associated with a  reduced incidence of Parkinson's and Alzheimer's diseases . People following the diet may have longer life expectancies and lower rates of chronic diseases  . The Dietary Guidelines for Americans recommends the Mediterranean diet as an eating plan to promote health and prevent disease  What Is the Mediterranean Diet?  . Healthy eating plan based on typical foods and recipes of Mediterranean-style cooking . The diet is primarily a plant based diet; these foods should make up a majority of meals   Starches - Plant based foods should make up a majority of meals - They are an important sources of vitamins, minerals, energy, antioxidants, and fiber - Choose whole grains, foods high in fiber and minimally processed items  - Typical grain sources include wheat, oats, barley, corn, brown rice, bulgar, farro, millet, polenta, couscous  - Various types of beans include chickpeas, lentils, fava beans, black beans, white beans   Fruits  Veggies - Large quantities of antioxidant rich fruits & veggies; 6 or more servings  - Vegetables can be eaten raw or lightly drizzled with oil and cooked  - Vegetables common to the traditional Mediterranean Diet include: artichokes, arugula, beets, broccoli, brussel sprouts, cabbage,  carrots, celery, collard greens, cucumbers, eggplant, kale, leeks, lemons, lettuce, mushrooms, okra, onions, peas, peppers, potatoes, pumpkin, radishes, rutabaga, shallots, spinach, sweet potatoes, turnips, zucchini - Fruits common to the Mediterranean Diet include: apples, apricots, avocados, cherries, clementines, dates, figs, grapefruits, grapes, melons, nectarines, oranges, peaches, pears, pomegranates, strawberries, tangerines  Fats - Replace butter and margarine with healthy oils, such as olive oil, canola oil, and tahini  - Limit nuts to no more than a handful a day  - Nuts include walnuts, almonds, pecans, pistachios, pine nuts  - Limit or avoid candied, honey roasted or heavily salted nuts - Olives are central to the [employer] - can be eaten whole or used in a variety of dishes   Meats Protein - Limiting red meat: no more than a few times a month - When eating red meat: choose lean cuts and keep the portion to the size of deck of cards - Eggs: approx. 0 to 4 times a week  - Fish and lean poultry: at least 2 a week  - Healthy protein sources include, chicken, [place], lean beef, lamb - Increase intake of seafood such as tuna, salmon, trout, mackerel, shrimp, scallops - Avoid or limit high fat processed meats such as sausage and bacon  Dairy - Include moderate amounts of low fat dairy products  - Focus on healthy dairy such as fat free yogurt, skim milk, low or reduced fat cheese - Limit dairy products higher in fat such as whole or 2% milk, cheese, ice cream  Alcohol - Moderate amounts of red wine is ok  - No more than 5 oz daily for women (all ages) and men older than age 65  - No more than 10 oz of wine daily for men younger than 51  Other - Limit sweets and other desserts  - Use herbs and spices instead of salt to flavor foods  - Herbs and spices common to the traditional Mediterranean Diet include: basil, bay leaves, chives, cloves, cumin, fennel, garlic, lavender, marjoram,  mint, oregano, parsley, pepper, rosemary, sage, savory, sumac, tarragon, thyme   It's not just a diet, it's a lifestyle:  . The Mediterranean diet includes lifestyle factors typical of those in the region  . Foods, drinks and meals are best eaten with others and savored . Daily physical activity is  important for overall good health . This could be strenuous exercise like running and aerobics . This could also be more leisurely activities such as walking, housework, yard-work, or taking the stairs . Moderation is the key; a balanced and healthy diet accommodates most foods and drinks . Consider portion sizes and frequency of consumption of certain foods   Meal Ideas & Options:  . Breakfast:  o Whole wheat toast or whole wheat English muffins with peanut butter & hard boiled egg o Steel cut oats topped with apples & cinnamon and skim milk  o Fresh fruit: banana, strawberries, melon, berries, peaches  o Smoothies: strawberries, bananas, greek yogurt, peanut butter o Low fat greek yogurt with blueberries and granola  o Egg white omelet with spinach and mushrooms o Breakfast couscous: whole wheat couscous, apricots, skim milk, cranberries  . Sandwiches:  o Hummus and grilled vegetables (peppers, zucchini, squash) on whole wheat bread   o Grilled chicken on whole wheat pita with lettuce, tomatoes, cucumbers or tzatziki  o Tuna salad on whole wheat bread: tuna salad made with greek yogurt, olives, red peppers, capers, green onions o Garlic rosemary lamb pita: lamb sauted with garlic, rosemary, salt & pepper; add lettuce, cucumber, greek yogurt to pita - flavor with lemon juice and black pepper  . Seafood:  o Mediterranean grilled salmon, seasoned with garlic, basil, parsley, lemon juice and black pepper o Shrimp, lemon, and spinach whole-grain pasta salad made with low fat greek yogurt  o Seared scallops with lemon orzo  o Seared tuna steaks seasoned salt, pepper, coriander topped with tomato  mixture of olives, tomatoes, olive oil, minced garlic, parsley, green onions and cappers  . Meats:  o Herbed greek chicken salad with kalamata olives, cucumber, feta  o Red bell peppers stuffed with spinach, bulgur, lean ground beef (or lentils) & topped with feta   o Kebabs: skewers of chicken, tomatoes, onions, zucchini, squash  o [place] burgers: made with red onions, mint, dill, lemon juice, feta cheese topped with roasted red peppers . Vegetarian o Cucumber salad: cucumbers, artichoke hearts, celery, red onion, feta cheese, tossed in olive oil & lemon juice  o Hummus and whole grain pita points with a greek salad (lettuce, tomato, feta, olives, cucumbers, red onion) o Lentil soup with celery, carrots made with vegetable broth, garlic, salt and pepper  o Tabouli salad: parsley, bulgur, mint, scallions, cucumbers, tomato, radishes, lemon juice, olive oil, salt and pepper.         Standley Brooking. Panosh M.D.

## 2014-07-08 ENCOUNTER — Encounter: Payer: Self-pay | Admitting: Internal Medicine

## 2014-07-08 ENCOUNTER — Ambulatory Visit (INDEPENDENT_AMBULATORY_CARE_PROVIDER_SITE_OTHER): Payer: BLUE CROSS/BLUE SHIELD | Admitting: Internal Medicine

## 2014-07-08 VITALS — BP 120/90 | Temp 98.3°F | Wt 168.9 lb

## 2014-07-08 DIAGNOSIS — H1013 Acute atopic conjunctivitis, bilateral: Secondary | ICD-10-CM

## 2014-07-08 DIAGNOSIS — L299 Pruritus, unspecified: Secondary | ICD-10-CM | POA: Diagnosis not present

## 2014-07-08 DIAGNOSIS — J309 Allergic rhinitis, unspecified: Principal | ICD-10-CM

## 2014-07-08 DIAGNOSIS — H1010 Acute atopic conjunctivitis, unspecified eye: Secondary | ICD-10-CM | POA: Insufficient documentation

## 2014-07-08 MED ORDER — AZELASTINE HCL 0.05 % OP SOLN: 1.0000 [drp] | Freq: Two times a day (BID) | OPHTHALMIC | Status: DC

## 2014-07-08 NOTE — Progress Notes (Signed)
Pre visit review using our clinic review tool, if applicable. No additional management support is needed unless otherwise documented below in the visit note.  Chief Complaint  Patient presents with  . Allergies    2-3wks.  Itching, nasal congestion and some sore throat    HPI: Patient Jonathan [name]  comes in today for SDA for  new problem evaluation. itchuin all over head and face. For weeks  No fever   Nose eyess  Throat  . Slight runny st ffy  Using   claritin  Zyrtec d no help . Nasal spray   .Marland Kitchenmucinex  No eye drops.  No fever no contacts vision changes hard to seleep at night from itching  No asthma flare    Outside tennis and runs.  ROS: See pertinent positives and negatives per HPI.  Past Medical History  Diagnosis Date  . ADD (attention deficit disorder)   . IBS (irritable bowel syndrome)   . Allergy   . Asthma     hx consult Dr Annamaria Boots.    Family History  Problem Relation Age of Onset  . Hyperlipidemia Father     History   Social History  . Marital Status: Single    Spouse Name: N/A  . Number of Children: N/A  . Years of Education: N/A   Social History Main Topics  . Smoking status: Never Smoker   . Smokeless tobacco: Not on file  . Alcohol Use: Yes     Comment: socially  . Drug Use: No  . Sexual Activity: Not on file   Other Topics Concern  . 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    No pets   Regular exercise- yes runner 2 x per week  Lift 2 x per week    No ets.        Outpatient Encounter Prescriptions as of 07/08/2014  Medication Sig  . amphetamine-dextroamphetamine (ADDERALL XR) 25 MG 24 hr capsule Take one capsule daily  . amphetamine-dextroamphetamine (ADDERALL XR) 25 MG 24 hr capsule Take one capsule daily.  Marland Kitchen amphetamine-dextroamphetamine (ADDERALL XR) 25 MG 24 hr capsule Take 1 capsule daily  . loratadine  (CLARITIN) 10 MG tablet Take 10 mg by mouth daily.    Marland Kitchen albuterol (PROAIR HFA) 108 (90 BASE) MCG/ACT inhaler Inhale 2 puffs into the lungs every 6 (six) hours as needed.    Marland Kitchen azelastine (OPTIVAR) 0.05 % ophthalmic solution Place 1 drop into both eyes 2 (two) times daily.    EXAM:  BP 120/90 mmHg  Temp(Src) 98.3 F (36.8 C) (Oral)  Wt 168 lb 14.4 oz (76.613 kg)  Body mass index is 24.76 kg/(m^2).  GENERAL: vitals reviewed and listed above, alert, oriented, appears well hydrated and in no acute distress mi ld congestion no rash  HEENT: atraumatic, conjunctiva  Min redness no dc  no obvious abnormalities on inspection of external nose and ears tms clear  Nares slight congested  OP : no lesion edema or exudate  NECK: no obvious masses on inspection palpation  LUNGS: clear to auscultation bilaterally, no wheezes, rales or rhonchi, good air movement CV: HRRR, no clubbing cyanosis or  peripheral edema nl cap refill  PSYCH: pleasant and cooperative, no obvious depression or anxiety  ASSESSMENT AND PLAN:  Discussed the following assessment and plan:  Allergic conjunctivitis  and rhinitis, bilateral - seasonal itching  throoughout upper  add INCS contoller  and  eye drops for now  consider short dourse pred and singulair if needed .expectant  management,  Asthma  quiescent -Patient advised to return or notify health care team  if symptoms worsen ,persist or new concerns arise.  Patient Instructions  This acts like seasonal allergy  Sx    Continues .   Antihistamine .  Every day   Add nasal cortisone .  2 sprays every   Day each nostril   flonase and nasacort . Prevention . Stay on this during season .   Can add allergy eye drops cool compresses for relief of eye itching .  Keep windows closed   When can .   If all of this not helping we can add a short course of prednisone  Call if needed  ( dont need ov  If no fever owr other issues)   And other intervnetions . Allergic  Rhinitis Allergic rhinitis is when the mucous membranes in the nose respond to allergens. Allergens are particles in the air that cause your body to have an allergic reaction. This causes you to release allergic antibodies. Through a chain of events, these eventually cause you to release histamine into the blood stream. Although meant to protect the body, it is this release of histamine that causes your discomfort, such as frequent sneezing, congestion, and an itchy, runny nose.  CAUSES  Seasonal allergic rhinitis (hay fever) is caused by pollen allergens that may come from grasses, trees, and weeds. Year-round allergic rhinitis (perennial allergic rhinitis) is caused by allergens such as house dust mites, pet dander, and mold spores.  SYMPTOMS   Nasal stuffiness (congestion).  Itchy, runny nose with sneezing and tearing of the eyes. DIAGNOSIS  Your health care provider can help you determine the allergen or allergens that trigger your symptoms. If you and your health care provider are unable to determine the allergen, skin or blood testing may be used. TREATMENT  Allergic rhinitis does not have a cure, but it can be controlled by:  Medicines and allergy shots (immunotherapy).  Avoiding the allergen. Hay fever may often be treated with antihistamines in pill or nasal spray forms. Antihistamines block the effects of histamine. There are over-the-counter medicines that may help with nasal congestion and swelling around the eyes. Check with your health care provider before taking or giving this medicine.  If avoiding the allergen or the medicine prescribed do not work, there are many new medicines your health care provider can prescribe. Stronger medicine may be used if initial measures are ineffective. Desensitizing injections can be used if medicine and avoidance does not work. Desensitization is when a patient is given ongoing shots until the body becomes less sensitive to the allergen. Make sure  you follow up with your health care provider if problems continue. HOME CARE INSTRUCTIONS It is not possible to completely avoid allergens, but you can reduce your symptoms by taking steps to limit your exposure to them. It helps to know exactly what you are allergic to so that you can avoid your specific triggers. SEEK MEDICAL CARE IF:   You have a fever.  You develop a cough that does not stop easily (persistent).  You have shortness of breath.  You start wheezing.  Symptoms interfere with normal daily activities. Document Released: 12/06/2000 Document Revised: 03/18/2013 Document Reviewed: 11/18/2012 [facility] Patient Information 2015 [place], [place]. This information is not intended to replace advice given  to you by your health care provider. Make sure you discuss any questions you have with your health care provider.      Standley Brooking. Lancer Thurner M.D.

## 2014-07-08 NOTE — Patient Instructions (Addendum)
This acts like seasonal allergy  Sx    Continues .   Antihistamine .  Every day   Add nasal cortisone .  2 sprays every   Day each nostril   flonase and nasacort . Prevention . Stay on this during season .   Can add allergy eye drops cool compresses for relief of eye itching .  Keep windows closed   When can .   If all of this not helping we can add a short course of prednisone  Call if needed  ( dont need ov  If no fever owr other issues)   And other intervnetions . Allergic Rhinitis Allergic rhinitis is when the mucous membranes in the nose respond to allergens. Allergens are particles in the air that cause your body to have an allergic reaction. This causes you to release allergic antibodies. Through a chain of events, these eventually cause you to release histamine into the blood stream. Although meant to protect the body, it is this release of histamine that causes your discomfort, such as frequent sneezing, congestion, and an itchy, runny nose.  CAUSES  Seasonal allergic rhinitis (hay fever) is caused by pollen allergens that may come from grasses, trees, and weeds. Year-round allergic rhinitis (perennial allergic rhinitis) is caused by allergens such as house dust mites, pet dander, and mold spores.  SYMPTOMS   Nasal stuffiness (congestion).  Itchy, runny nose with sneezing and tearing of the eyes. DIAGNOSIS  Your health care provider can help you determine the allergen or allergens that trigger your symptoms. If you and your health care provider are unable to determine the allergen, skin or blood testing may be used. TREATMENT  Allergic rhinitis does not have a cure, but it can be controlled by:  Medicines and allergy shots (immunotherapy).  Avoiding the allergen. Hay fever may often be treated with antihistamines in pill or nasal spray forms. Antihistamines block the effects of histamine. There are over-the-counter medicines that may help with nasal congestion and swelling  around the eyes. Check with your health care provider before taking or giving this medicine.  If avoiding the allergen or the medicine prescribed do not work, there are many new medicines your health care provider can prescribe. Stronger medicine may be used if initial measures are ineffective. Desensitizing injections can be used if medicine and avoidance does not work. Desensitization is when a patient is given ongoing shots until the body becomes less sensitive to the allergen. Make sure you follow up with your health care provider if problems continue. HOME CARE INSTRUCTIONS It is not possible to completely avoid allergens, but you can reduce your symptoms by taking steps to limit your exposure to them. It helps to know exactly what you are allergic to so that you can avoid your specific triggers. SEEK MEDICAL CARE IF:   You have a fever.  You develop a cough that does not stop easily (persistent).  You have shortness of breath.  You start wheezing.  Symptoms interfere with normal daily activities. Document Released: 12/06/2000 Document Revised: 03/18/2013 Document Reviewed: 11/18/2012 [facility] Patient Information 2015 [place], [place]. This information is not intended to replace advice given to you by your health care provider. Make sure you discuss any questions you have with your health care provider.

## 2014-09-21 ENCOUNTER — Telehealth: Payer: Self-pay | Admitting: Internal Medicine

## 2014-09-21 MED ORDER — AMPHETAMINE-DEXTROAMPHET ER 25 MG PO CP24: ORAL_CAPSULE | ORAL | Status: DC

## 2014-09-21 NOTE — Telephone Encounter (Signed)
Left a message notifying the pt to pick up at the front desk tomorrow since the office will be closing today at 5.  Time is now 4:57 PM.

## 2014-09-21 NOTE — Telephone Encounter (Signed)
Pt needs new rx generic adderall xr 25 mg. Pt has enough med to last until wed

## 2014-11-09 ENCOUNTER — Telehealth: Payer: Self-pay | Admitting: Internal Medicine

## 2014-11-09 NOTE — Telephone Encounter (Signed)
Pt have some fatigue, weakness and states a couple of other issues he would like to discuss .would like appt this week.pls advise

## 2014-11-10 NOTE — Telephone Encounter (Signed)
Pt needs 30 minute appt due to multiple issues. Please help him to make an appt.  Not sure if we have 30 minute availability this week.

## 2014-11-11 NOTE — Telephone Encounter (Signed)
Pt has been scheduled.  °

## 2014-11-13 ENCOUNTER — Encounter: Payer: Self-pay | Admitting: Internal Medicine

## 2014-11-13 ENCOUNTER — Ambulatory Visit (INDEPENDENT_AMBULATORY_CARE_PROVIDER_SITE_OTHER): Payer: BLUE CROSS/BLUE SHIELD | Admitting: Internal Medicine

## 2014-11-13 VITALS — BP 128/80 | Temp 98.1°F | Wt 170.0 lb

## 2014-11-13 DIAGNOSIS — R42 Dizziness and giddiness: Secondary | ICD-10-CM

## 2014-11-13 DIAGNOSIS — F988 Other specified behavioral and emotional disorders with onset usually occurring in childhood and adolescence: Secondary | ICD-10-CM

## 2014-11-13 DIAGNOSIS — R6889 Other general symptoms and signs: Secondary | ICD-10-CM | POA: Diagnosis not present

## 2014-11-13 DIAGNOSIS — R5383 Other fatigue: Secondary | ICD-10-CM

## 2014-11-13 DIAGNOSIS — K921 Melena: Secondary | ICD-10-CM | POA: Diagnosis not present

## 2014-11-13 DIAGNOSIS — F909 Attention-deficit hyperactivity disorder, unspecified type: Secondary | ICD-10-CM

## 2014-11-13 DIAGNOSIS — Z79899 Other long term (current) drug therapy: Secondary | ICD-10-CM

## 2014-11-13 HISTORY — DX: Melena: K92.1

## 2014-11-13 LAB — CBC WITH DIFFERENTIAL/PLATELET
Basophils Absolute: 0 10*3/uL (ref 0.0–0.1)
Basophils Relative: 0.5 % (ref 0.0–3.0)
Eosinophils Absolute: 0.1 10*3/uL (ref 0.0–0.7)
Eosinophils Relative: 2.4 % (ref 0.0–5.0)
HEMATOCRIT: 43.5 % (ref 39.0–52.0)
Hemoglobin: 14.9 g/dL (ref 13.0–17.0)
Lymphocytes Relative: 32 % (ref 12.0–46.0)
Lymphs Abs: 1.4 10*3/uL (ref 0.7–4.0)
MCHC: 34.2 g/dL (ref 30.0–36.0)
MCV: 92.8 fl (ref 78.0–100.0)
MONO ABS: 0.4 10*3/uL (ref 0.1–1.0)
Monocytes Relative: 10 % (ref 3.0–12.0)
NEUTROS PCT: 55.1 % (ref 43.0–77.0)
Neutro Abs: 2.4 10*3/uL (ref 1.4–7.7)
PLATELETS: 216 10*3/uL (ref 150.0–400.0)
RBC: 4.68 Mil/uL (ref 4.22–5.81)
RDW: 12.7 % (ref 11.5–15.5)
WBC: 4.3 10*3/uL (ref 4.0–10.5)

## 2014-11-13 LAB — COMPREHENSIVE METABOLIC PANEL
ALT: 23 U/L (ref 0–53)
AST: 26 U/L (ref 0–37)
Albumin: 4.4 g/dL (ref 3.5–5.2)
Alkaline Phosphatase: 90 U/L (ref 39–117)
BUN: 19 mg/dL (ref 6–23)
CALCIUM: 9.9 mg/dL (ref 8.4–10.5)
CHLORIDE: 103 meq/L (ref 96–112)
CO2: 32 mEq/L (ref 19–32)
CREATININE: 0.96 mg/dL (ref 0.40–1.50)
GFR: 94.63 mL/min (ref >60.00)
Glucose, Bld: 88 mg/dL (ref 70–99)
Potassium: 4.9 mEq/L (ref 3.5–5.1)
Sodium: 140 mEq/L (ref 135–145)
Total Bilirubin: 0.5 mg/dL (ref 0.2–1.2)
Total Protein: 7.2 g/dL (ref 6.0–8.3)

## 2014-11-13 LAB — T4, FREE: FREE T4: 0.69 ng/dL (ref 0.60–1.60)

## 2014-11-13 LAB — TSH: TSH: 1.9 u[IU]/mL (ref 0.35–4.50)

## 2014-11-13 LAB — SEDIMENTATION RATE: Sed Rate: 12 mm/hr (ref 0–22)

## 2014-11-13 MED ORDER — AMPHETAMINE-DEXTROAMPHET ER 25 MG PO CP24: ORAL_CAPSULE | ORAL | Status: DC

## 2014-11-13 NOTE — Progress Notes (Signed)
Pre visit review using our clinic review tool, if applicable. No additional management support is needed unless otherwise documented below in the visit note.  Chief Complaint  Patient presents with  . Fatigue    Fatigue and dizziness mostly in the afternoon X 1 month.  Has noticed blood in his bowel movement a few times over the past six months.  . Dizziness  . Blood in Feces    HPI: Patient Jonathan [name]  comes in today for  new problem evaluation.he has been having problems and change in his health feeling over about the last 1 month. He noticed a weakand dizzy like feeling in the afternoons question if it's related to eating. He is also noted insidious onset of decreased exercise tolerance since March  More tired quicker .   Insidious onset of this  .Usually can run4-6 [place] before gets fatigue  Now only 2-4 miles before fatigue  No cp no real shortness of breath although has had a history of exercise-induced asthma. No cough hemoptysis. Blood in stool 2-3 x stool bright red over the last number but Not painful. On tp  Red in toilets   Bowel. Habits show regular bowel movements one a day. Sleep the same.   Negative TD occasional social alcohol less than 10 a week He's been taking his Adderall with no significant change and hasn't had a side effect from that in the past.  Also would like refills on his Adderall. He he had an appointment in a couple weeks but if we can fill it today can cancel that. 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 ROS: See pertinent positives and negatives per HPI. No cough wheeze  Syncope bleeding other than above  abd pain fever chills   Some stress at work  Phone app says he was able to go 8 miles last year  Negative family history of significant bowel disease.   Past Medical History  Diagnosis Date  . ADD (attention deficit  disorder)   . IBS (irritable bowel syndrome)   . Allergy   . Asthma     hx consult Dr Annamaria Boots.    Family History  Problem Relation Age of Onset  . Hyperlipidemia Father     Social History   Social History  . Marital Status: Single    Spouse Name: N/A  . Number of Children: N/A  . Years of Education: N/A   Social History Main Topics  . Smoking status: Never Smoker   . Smokeless tobacco: None  . Alcohol Use: Yes     Comment: socially  . Drug Use: No  . Sexual Activity: Not Asked   Other Topics Concern  . 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    No pets   Regular exercise- yes runner 2 x per week  Lift 2 x per week    No ets.        Outpatient Prescriptions Prior to Visit  Medication Sig Dispense Refill  . amphetamine-dextroamphetamine (ADDERALL XR) 25 MG 24 hr capsule Take one capsule daily 30 capsule 0  . amphetamine-dextroamphetamine (ADDERALL XR) 25 MG 24 hr capsule Take one capsule daily. 30 capsule 0  .  amphetamine-dextroamphetamine (ADDERALL XR) 25 MG 24 hr capsule Take 1 capsule daily 30 capsule 0  . albuterol (PROAIR HFA) 108 (90 BASE) MCG/ACT inhaler Inhale 2 puffs into the lungs every 6 (six) hours as needed.      Marland Kitchen azelastine (OPTIVAR) 0.05 % ophthalmic solution Place 1 drop into both eyes 2 (two) times daily. (Patient not taking: Reported on 11/13/2014) 6 mL 3  . loratadine (CLARITIN) 10 MG tablet Take 10 mg by mouth daily.       No facility-administered medications prior to visit.     EXAM:  BP 128/80 mmHg  Temp(Src) 98.1 F (36.7 C) (Oral)  Wt 170 lb (77.111 kg)  Body mass index is 24.92 kg/(m^2).  GENERAL: vitals reviewed and listed above, alert, oriented, appears well hydrated and in no acute distress HEENT: atraumatic, conjunctiva  clear, no obvious abnormalities on inspection of external nose and earsNECK: no obvious  masses on inspection palpation no bruits are noted LUNGS: clear to auscultation bilaterally, no wheezes, rales or rhonchi, good air movement Abdomen:  Sof,t normal bowel sounds without hepatosplenomegaly, no guarding rebound or masses no CVA tenderness CV: HRRR, no clubbing cyanosis or  peripheral edema nl cap refill Skin: normal capillary refill ,turgor , color: No acute rashes ,petechiae or bruising Skin: normal capillary refill ,turgor , color: No acute rashes ,petechiae or bruising MS: moves all extremities without noticeable focal  abnormality PSYCH: pleasant and cooperative, no obvious depression or anxiety EKG shows sinus rhythm rate 72 normal intervals. No anterior forces in V3 inferolateral ST elevation repolarization variant. There is no previous EKG to compare.  ASSESSMENT AND PLAN:  Discussed the following assessment and plan:  Other fatigue - Plan: TSH, T4, free, CBC with Differential/Platelet, Sedimentation rate, Comprehensive metabolic panel, EKG 63-FHLK, Ambulatory referral to Gastroenterology  Blood in stool - painless  ? not mixed in  and gi opinoin  r no chnag ein bowel habits  - Plan: TSH, T4, free, CBC with Differential/Platelet, Sedimentation rate, Comprehensive metabolic panel, Ambulatory referral to Gastroenterology  Dizziness - Plan: TSH, T4, free, CBC with Differential/Platelet, Sedimentation rate, Comprehensive metabolic panel, EKG 56-YBWL  Decreased exercise tolerance - Plan: TSH, T4, free, CBC with Differential/Platelet, Sedimentation rate, Comprehensive metabolic panel, EKG 89-HTDS  Attention deficit disorder - refill medicine today cannot find tox screen results  but I agree that we thought were done in the spring still appears to have benefit than risk  Medication management Change in physical status unknown cause see has been able to exercise without difficulty in the past. He also feels tired in the afternoons when he is not exercising. Uncertain implications  of the intermittent blood in the stool painless. Does not sound like colitis. We'll check labs anemia thyroid metabolic. If not revealing consider cardiopulmonary exercise assessment.  His exam is reassuring. -Patient advised to return or notify health care team  if symptoms worsen ,persist or new concerns arise. (  Patient Instructions  Will notify you  of labs when available. And then decide on next step Checking for anemia and blood sugar and thyroid  Get stool tests when you dont see blood.  You will be contacted  About GI consult.         Standley Brooking. Panosh M.D.

## 2014-11-13 NOTE — Patient Instructions (Addendum)
Will notify you  of labs when available. And then decide on next step Checking for anemia and blood sugar and thyroid  Get stool tests when you dont see blood.  You will be contacted  About GI consult.

## 2014-11-17 ENCOUNTER — Encounter: Payer: Self-pay | Admitting: Physician Assistant

## 2014-11-19 ENCOUNTER — Other Ambulatory Visit: Payer: Self-pay | Admitting: Family Medicine

## 2014-11-19 ENCOUNTER — Telehealth (HOSPITAL_COMMUNITY): Payer: Self-pay | Admitting: *Deleted

## 2014-11-19 DIAGNOSIS — R42 Dizziness and giddiness: Secondary | ICD-10-CM

## 2014-11-19 DIAGNOSIS — R6889 Other general symptoms and signs: Secondary | ICD-10-CM

## 2014-11-19 DIAGNOSIS — R5383 Other fatigue: Secondary | ICD-10-CM

## 2014-12-04 ENCOUNTER — Ambulatory Visit (INDEPENDENT_AMBULATORY_CARE_PROVIDER_SITE_OTHER): Payer: BLUE CROSS/BLUE SHIELD | Admitting: Physician Assistant

## 2014-12-04 ENCOUNTER — Encounter: Payer: Self-pay | Admitting: Physician Assistant

## 2014-12-04 ENCOUNTER — Ambulatory Visit: Payer: BLUE CROSS/BLUE SHIELD | Admitting: Internal Medicine

## 2014-12-04 VITALS — BP 110/70 | HR 88 | Ht 69.5 in | Wt 169.0 lb

## 2014-12-04 DIAGNOSIS — K921 Melena: Secondary | ICD-10-CM

## 2014-12-04 MED ORDER — SOD PICOSULFATE-MAG OX-CIT ACD 10-3.5-12 MG-GM-GM PO PACK: 1.0000 | PACK | Freq: Once | ORAL | Status: DC

## 2014-12-04 NOTE — Patient Instructions (Signed)

## 2014-12-05 ENCOUNTER — Encounter: Payer: Self-pay | Admitting: Physician Assistant

## 2014-12-05 NOTE — Progress Notes (Signed)
Patient ID: Jonathan [name], male   DOB: 1980-11-02, 34 y.o.   MRN: [id number]    HPI:  Jonathan [name] is a 34 y.o.   male referred by Panosh, Neta Mends, MD  For evaluation of rectal bleeding.    Jonathan [name] is known to Dr. Arlyce Dice and was last seen in 2006. He had been followed for IBS , and he also had an upper endoscopy in May of 2006 that was normal. Jonathan [name] states his bowel movements have been fairly regular for the past several years , but over the past couple of months he has had several episodes of bright red blood dripping into the commode with bowel movements , as well as blood on the toilet tissue. He denies any rectal pain, itching, or burning. He denies a sensation of incomplete evacuation. He has had no mucus with his stools. He denies use of non-steroidal anti-inflammatory drugs. He denies a family history of colon cancer, colon polyps, or inflammatory bowel disease. He has no associated abdominal pain.   Past Medical History  Diagnosis Date  . ADD (attention deficit disorder)   . IBS (irritable bowel syndrome)   . Allergy   . Asthma     hx consult Dr Maple Hudson.    Past Surgical History  Procedure Laterality Date  . Upper gastrointestinal endoscopy      eval by GI neg   Family History  Problem Relation Age of Onset  . Hyperlipidemia Father    Social History  Substance Use Topics  . Smoking status: Never Smoker   . Smokeless tobacco: Former Neurosurgeon    Quit date: 12/04/2014  . Alcohol Use: 0.0 oz/week    0 Standard drinks or equivalent per week     Comment: socially   Current Outpatient Prescriptions  Medication Sig Dispense Refill  . albuterol (PROAIR HFA) 108 (90 BASE) MCG/ACT inhaler Inhale 2 puffs into the lungs every 6 (six) hours as needed.      Marland Kitchen amphetamine-dextroamphetamine (ADDERALL XR) 25 MG 24 hr capsule Take one capsule daily 30 capsule 0  . azelastine (OPTIVAR) 0.05 % ophthalmic solution Place 1 drop into both eyes 2 (two) times daily. 6 mL 3  . loratadine (CLARITIN) 10 MG  tablet Take 10 mg by mouth daily.      . Sod Picosulfate-Mag Ox-Cit Acd 10-3.5-12 MG-GM-GM PACK Take 1 kit by mouth once. 1 each 0   No current facility-administered medications for this visit.   No Known Allergies   Review of Systems: Gen: Denies any fever, chills, sweats, anorexia, fatigue, weakness, malaise, weight loss, and sleep disorder CV: Denies chest pain, angina, palpitations, syncope, orthopnea, PND, peripheral edema, and claudication. Resp: Denies dyspnea at rest, dyspnea with exercise, cough, sputum, wheezing, coughing up blood, and pleurisy. GI: Denies vomiting blood, jaundice, and fecal incontinence.   Denies dysphagia or odynophagia. GU : Denies urinary burning, blood in urine, urinary frequency, urinary hesitancy, nocturnal urination, and urinary incontinence. MS: Denies joint pain, limitation of movement, and swelling, stiffness, low back pain, extremity pain. Denies muscle weakness, cramps, atrophy.  Derm: Denies rash, itching, dry skin, hives, moles, warts, or unhealing ulcers.  Psych: Denies depression, anxiety, memory loss, suicidal ideation, hallucinations, paranoia, and confusion. Heme: Denies bruising, bleeding, and enlarged lymph nodes. Neuro:  Denies any headaches, dizziness, paresthesias. Endo:  Denies any problems with DM, thyroid, adrenal function  LAB RESULTS:  CBC 11/13/2014  WBC 4.3, hemoglobin 14.9, hematocrit 43.5, platelets 216,000.   sedimentation rate 12  Prior Endoscopies:  See history of present illness  Physical Exam: BP 110/70 mmHg  Pulse 88  Ht 5' 9.5" (1.765 m)  Wt 169 lb (76.658 kg)  BMI 24.61 kg/m2 Constitutional: Pleasant,well-developed,male in no acute distress. HEENT: Normocephalic and atraumatic. Conjunctivae are normal. No scleral icterus. Neck supple.  No JVD Cardiovascular: Normal rate, regular rhythm.  Pulmonary/chest: Effort normal and breath sounds normal. No wheezing, rales or rhonchi. Abdominal: Soft, nondistended,  nontender. Bowel sounds active throughout. There are no masses palpable. No hepatomegaly. Rectal:  No  Hemorrhoids noted Extremities: no edema Lymphadenopathy: No cervical adenopathy noted. Neurological: Alert and oriented to person place and time. Skin: Skin is warm and dry. No rashes noted. Psychiatric: Normal mood and affect. Behavior is normal.  ASSESSMENT AND PLAN:  34 year old male status post several  Episodes of hematochezia referred for evaluation. Patient has no obvious hemorrhoids on today's exam. He will be scheduled for colonoscopy to evaluate for polyps, neoplasia, proctitis, internal hemorrhoids , etc.The risks, benefits, and alternatives to colonoscopy with possible biopsy and possible polypectomy were discussed with the patient and they consent to proceed.     Jonathan [name], Deloris Ping 12/05/2014, 5:39 PM  CC: Panosh, Standley Brooking, MD

## 2014-12-08 ENCOUNTER — Telehealth (HOSPITAL_COMMUNITY): Payer: Self-pay | Admitting: *Deleted

## 2014-12-14 NOTE — Progress Notes (Signed)
Reviewed and agree with management. Robert D. Kaplan, M.D., FACG  

## 2014-12-24 ENCOUNTER — Ambulatory Visit (INDEPENDENT_AMBULATORY_CARE_PROVIDER_SITE_OTHER): Payer: BLUE CROSS/BLUE SHIELD | Admitting: Cardiovascular Disease

## 2014-12-24 ENCOUNTER — Encounter: Payer: Self-pay | Admitting: Cardiovascular Disease

## 2014-12-24 VITALS — BP 118/78 | HR 93 | Ht 69.5 in | Wt 167.8 lb

## 2014-12-24 DIAGNOSIS — R5382 Chronic fatigue, unspecified: Secondary | ICD-10-CM | POA: Diagnosis not present

## 2014-12-24 DIAGNOSIS — R5383 Other fatigue: Secondary | ICD-10-CM | POA: Insufficient documentation

## 2014-12-24 NOTE — Patient Instructions (Signed)
Medication Instructions:  Your physician recommends that you continue on your current medications as directed. Please refer to the Current Medication list given to you today.   Labwork: None Ordered   Testing/Procedures: None Ordered   Follow-Up: Your physician recommends that you schedule a follow-up appointment in: as needed with Dr. Acie Fredrickson.   Increase your intake of fluids (water with electrolyte tabs like Nun tablets, or gatorade) , protein ( hard boiled eggs, chicken, fish) , and a electrolytes ( V-8 juice, salt, potassium chloride  which is sold as No-Salt

## 2014-12-24 NOTE — Progress Notes (Signed)
Cardiology Office Note   Date:  12/24/2014   ID:  Jonathan [name], DOB 1980-11-25, MRN [id number]  PCP:  Lottie Dawson, MD  Cardiologist:   Thayer Headings, MD   Chief Complaint  Patient presents with  . Fatigue    light headed     Problem List 1. Generalized weakness 2. Lightheadedness  History of Present Illness: Jonathan [name] is a 34 y.o. male who presents for evaluation of generalized fatigue . Jonathan [name] runs out of energy at the end of the day. Weak and lightheadedness.   No CP or dyspnea  Has restarted running recently .  Is training for a 1/2 marathon. Is now running 6-7  [place] at a time.  Will run 9 miles this weekend .  9:30 - 10 minute miles  No syncopal episodes . Has some allergies that cause him some shortness of breath.  Some of his symptoms are c/w volume depletion .    Past Medical History  Diagnosis Date  . ADD (attention deficit disorder)   . IBS (irritable bowel syndrome)   . Allergy   . Asthma     hx consult Dr Annamaria Boots.    Past Surgical History  Procedure Laterality Date  . Upper gastrointestinal endoscopy      eval by GI neg     Current Outpatient Prescriptions  Medication Sig Dispense Refill  . amphetamine-dextroamphetamine (ADDERALL XR) 25 MG 24 hr capsule Take 25 mg by mouth every morning.    Marland Kitchen azelastine (OPTIVAR) 0.05 % ophthalmic solution Place 1 drop into both eyes 2 (two) times daily. 6 mL 3   No current facility-administered medications for this visit.    Allergies:   Review of patient's allergies indicates no known allergies.    Social History:  The patient  reports that he has never smoked. He quit smokeless tobacco use about 2 weeks ago. He reports that he drinks alcohol. He reports that he does not use illicit drugs.   Family History:  The patient's family history includes Hyperlipidemia in his father.    ROS:  Please see the history of present illness.    Review of Systems: Constitutional:  denies fever, chills,  diaphoresis, appetite change and fatigue.  HEENT: denies photophobia, eye pain, redness, hearing loss, ear pain, congestion, sore throat, rhinorrhea, sneezing, neck pain, neck stiffness and tinnitus.  Respiratory: denies SOB, DOE, cough, chest tightness, and wheezing.  Cardiovascular: denies chest pain, palpitations and leg swelling.  Gastrointestinal: denies nausea, vomiting, abdominal pain, diarrhea, constipation, blood in stool.  Genitourinary: denies dysuria, urgency, frequency, hematuria, flank pain and difficulty urinating.  Musculoskeletal: denies  myalgias, back pain, joint swelling, arthralgias and gait problem.   Skin: denies pallor, rash and wound.  Neurological: denies dizziness, seizures, syncope, weakness, light-headedness, numbness and headaches.   Hematological: denies adenopathy, easy bruising, personal or family bleeding history.  Psychiatric/ Behavioral: denies suicidal ideation, mood changes, confusion, nervousness, sleep disturbance and agitation.       All other systems are reviewed and negative.    PHYSICAL EXAM: VS:  BP 118/78 mmHg  Pulse 93  Ht 5' 9.5" (1.765 m)  Wt 76.114 kg (167 lb 12.8 oz)  BMI 24.43 kg/m2 , BMI Body mass index is 24.43 kg/(m^2). GEN: Well nourished, well developed, in no acute distress HEENT: normal Neck: no JVD, carotid bruits, or masses Cardiac: RRR; no murmurs, rubs, or gallops,no edema  Respiratory:  clear to auscultation bilaterally, normal work of breathing GI: soft, nontender, nondistended, + BS  MS: no deformity or atrophy Skin: warm and dry, no rash Neuro:  Strength and sensation are intact Psych: normal   EKG:  EKG is ordered today. The ekg ordered  Aug. 2016 - NSR with early repol     Recent Labs: 11/13/2014: ALT 23; BUN 19; Creatinine, Ser 0.96; Hemoglobin 14.9; Platelets 216.0; Potassium 4.9; Sodium 140; TSH 1.90    Lipid Panel    Component Value Date/Time   CHOL 203* 06/01/2014 0912   TRIG 168.0* 06/01/2014 0912    HDL 58.80 06/01/2014 0912   CHOLHDL 3 06/01/2014 0912   VLDL 33.6 06/01/2014 0912   LDLCALC 111* 06/01/2014 0912      Wt Readings from Last 3 Encounters:  12/24/14 76.114 kg (167 lb 12.8 oz)  12/04/14 76.658 kg (169 lb)  11/13/14 77.111 kg (170 lb)      Other studies Reviewed: Additional studies/ records that were reviewed today include: . Review of the above records demonstrates:    ASSESSMENT AND PLAN:  1.  Fatigue:  This is not likely to be cardiac in origin. Runs 6-7 miles a day without any CP or dyspnea. Has fatigue in general .  I suspect that he was having loin depletion issues. He is running in the middle of the summer days and I suspect that he was not drinking enough or eating of protein or salt. I've advised him to stay hydrated. We had long discussion about cardiac symptoms. I'll see him on an as-needed basis.  Current medicines are reviewed at length with the patient today.  The patient does not have concerns regarding medicines.  The following changes have been made:  no change  Labs/ tests ordered today include:  No orders of the defined types were placed in this encounter.     Disposition:   FU with me as needed.    Jonathan [name], Wonda Cheng, MD  12/24/2014 9:10 AM    [facility] [place], [place], [place]  [address] Phone: [phone]; Fax: [phone]   [facility]  [address] [place], [place]  [address] [phone]   Fax [phone]

## 2015-01-14 ENCOUNTER — Encounter: Payer: BLUE CROSS/BLUE SHIELD | Admitting: Gastroenterology

## 2015-01-15 ENCOUNTER — Encounter: Payer: BLUE CROSS/BLUE SHIELD | Admitting: Gastroenterology

## 2015-01-20 ENCOUNTER — Encounter: Payer: Self-pay | Admitting: Gastroenterology

## 2015-01-20 ENCOUNTER — Ambulatory Visit (AMBULATORY_SURGERY_CENTER): Payer: BLUE CROSS/BLUE SHIELD | Admitting: Gastroenterology

## 2015-01-20 VITALS — BP 121/76 | HR 69 | Temp 98.6°F | Resp 10 | Ht 69.5 in | Wt 169.0 lb

## 2015-01-20 DIAGNOSIS — K921 Melena: Secondary | ICD-10-CM | POA: Diagnosis present

## 2015-01-20 MED ORDER — SODIUM CHLORIDE 0.9 % IV SOLN: 500.0000 mL | INTRAVENOUS | Status: DC

## 2015-01-20 NOTE — Op Note (Signed)
[facility]  [address]. [place], [address]   COLONOSCOPY PROCEDURE REPORT  PATIENT: [name], Jonathan  MR#: [id number] BIRTHDATE: [birth year] , 34  yrs. old GENDER: male ENDOSCOPIST: Milus Banister, MD REFERRED EZ:MOQHU Darnelle Going, M.D. PROCEDURE DATE:  01/20/2015 PROCEDURE:   Colonoscopy, diagnostic First Screening Colonoscopy - Avg.  risk and is 50 yrs.  old or older - No.  Prior Negative Screening - Now for repeat screening. N/A  History of Adenoma - Now for follow-up colonoscopy & has been > or = to 3 yrs.  N/A  Recommend repeat exam, <10 yrs? No ASA CLASS:   Class I INDICATIONS:minor rectal bleeding. MEDICATIONS: Monitored anesthesia care, Propofol 250 mg IV, and lidocaine 40mg  IV  DESCRIPTION OF PROCEDURE:   After the risks benefits and alternatives of the procedure were thoroughly explained, informed consent was obtained.  The digital rectal exam revealed no abnormalities of the rectum.   The LB [id number]  endoscope was introduced through the anus and advanced to the terminal ileum which was intubated for a short distance. No adverse events experienced.   The quality of the prep was excellent.  The instrument was then slowly withdrawn as the colon was fully examined. Estimated blood loss is zero unless otherwise noted in this procedure report.   COLON FINDINGS: The examined terminal ileum appeared to be normal. A normal appearing cecum, ileocecal valve, and appendiceal orifice were identified.  The ascending, transverse, descending, sigmoid colon, and rectum appeared unremarkable.  Retroflexed views revealed no abnormalities. The time to cecum = 3.1 Withdrawal time = 7.0   The scope was withdrawn and the procedure completed. COMPLICATIONS: There were no immediate complications.  ENDOSCOPIC IMPRESSION: 1.   The examined terminal ileum appeared to be normal 2.   Normal colonoscopy 3.    The bleeding was likely from hemorrhoids which can come  and go.  RECOMMENDATIONS: Follow clinically.  If bleeding becomes more frequent, bothersome please call. Colon cancer screening (consider colonoscopy) should start at age 28.  eSigned:  Milus Banister, MD 01/20/2015 11:10 AM

## 2015-01-20 NOTE — Patient Instructions (Signed)
YOU HAD AN ENDOSCOPIC PROCEDURE TODAY AT THE [facility]:   Refer to the procedure report that was given to you for any specific questions about what was found during the examination.  If the procedure report does not answer your questions, please call your gastroenterologist to clarify.  If you requested that your care partner not be given the details of your procedure findings, then the procedure report has been included in a sealed envelope for you to review at your convenience later.  YOU SHOULD EXPECT: Some feelings of bloating in the abdomen. Passage of more gas than usual.  Walking can help get rid of the air that was put into your GI tract during the procedure and reduce the bloating. If you had a lower endoscopy (such as a colonoscopy or flexible sigmoidoscopy) you may notice spotting of blood in your stool or on the toilet paper. If you underwent a bowel prep for your procedure, you may not have a normal bowel movement for a few days.  Please Note:  You might notice some irritation and congestion in your nose or some drainage.  This is from the oxygen used during your procedure.  There is no need for concern and it should clear up in a day or so.  SYMPTOMS TO REPORT IMMEDIATELY:   Following lower endoscopy (colonoscopy or flexible sigmoidoscopy):  Excessive amounts of blood in the stool  Significant tenderness or worsening of abdominal pains  Swelling of the abdomen that is new, acute  Fever of 100F or higher   For urgent or emergent issues, a gastroenterologist can be reached at any hour by calling [phone].   DIET: Your first meal following the procedure should be a small meal and then it is ok to progress to your normal diet. Heavy or fried foods are harder to digest and may make you feel nauseous or bloated.  Likewise, meals heavy in dairy and vegetables can increase bloating.  Drink plenty of fluids but you should avoid alcoholic beverages for 24  hours.  ACTIVITY:  You should plan to take it easy for the rest of today and you should NOT DRIVE or use heavy machinery until tomorrow (because of the sedation medicines used during the test).    FOLLOW UP: Our staff will call the number listed on your records the next business day following your procedure to check on you and address any questions or concerns that you may have regarding the information given to you following your procedure. If we do not reach you, we will leave a message.  However, if you are feeling well and you are not experiencing any problems, there is no need to return our call.  We will assume that you have returned to your regular daily activities without incident.  If any biopsies were taken you will be contacted by phone or by letter within the next 1-3 weeks.  Please call us at [phone] if you have not heard about the biopsies in 3 weeks.    SIGNATURES/CONFIDENTIALITY: You and/or your care partner have signed paperwork which will be entered into your electronic medical record.  These signatures attest to the fact that that the information above on your After Visit Summary has been reviewed and is understood.  Full responsibility of the confidentiality of this discharge information lies with you and/or your care-partner. 

## 2015-01-20 NOTE — Progress Notes (Signed)
Stable to RR 

## 2015-01-21 ENCOUNTER — Telehealth: Payer: Self-pay

## 2015-01-21 NOTE — Telephone Encounter (Signed)
Left a message for the pt to call us back if any questions or concerns at [phone]. [facility]

## 2015-03-18 ENCOUNTER — Other Ambulatory Visit: Payer: Self-pay | Admitting: Internal Medicine

## 2015-03-18 NOTE — Telephone Encounter (Signed)
Jonathan [name] called saying he needs a refill of Adderall. Please give him a call if needed.  Pt's ph# [phone] Thank you.

## 2015-03-19 NOTE — Telephone Encounter (Signed)
Called and spoke with pt and pt states he has enough medication to last until Monday.  Advised pt that this message would be sent to Dr. Regis Bill.

## 2015-03-22 NOTE — Telephone Encounter (Signed)
Can refill 30  days   Need another tox screen before pick up med  Need rov  Before runs out  o f med    Need fu or his exercise sx

## 2015-03-23 ENCOUNTER — Encounter: Payer: Self-pay | Admitting: Internal Medicine

## 2015-03-23 MED ORDER — AMPHETAMINE-DEXTROAMPHET ER 25 MG PO CP24: 25.0000 mg | ORAL_CAPSULE | ORAL | Status: DC

## 2015-03-23 NOTE — Addendum Note (Signed)
Addended by: Colleen Can on: 03/23/2015 08:49 AM   Modules accepted: Orders

## 2015-03-23 NOTE — Telephone Encounter (Signed)
Called and left a message for pt that rx is ready for pickup and that a follow up appt is needed.

## 2015-04-19 ENCOUNTER — Other Ambulatory Visit: Payer: Self-pay | Admitting: Internal Medicine

## 2015-04-19 MED ORDER — AMPHETAMINE-DEXTROAMPHET ER 25 MG PO CP24: 25.0000 mg | ORAL_CAPSULE | ORAL | Status: DC

## 2015-04-19 NOTE — Telephone Encounter (Signed)
Pt request refill amphetamine-dextroamphetamine (ADDERALL XR) 25 MG 24 hr capsule  Pt will be out on Thursday

## 2015-04-19 NOTE — Telephone Encounter (Signed)
Pt last visit 11/13/14 Pt last Rx refill 03/23/15 #30

## 2015-04-19 NOTE — Telephone Encounter (Signed)
done

## 2015-04-20 NOTE — Telephone Encounter (Signed)
Patient notified that Rx ready to be picked up.

## 2015-05-17 ENCOUNTER — Ambulatory Visit (INDEPENDENT_AMBULATORY_CARE_PROVIDER_SITE_OTHER): Payer: BLUE CROSS/BLUE SHIELD | Admitting: Internal Medicine

## 2015-05-17 ENCOUNTER — Encounter: Payer: Self-pay | Admitting: Internal Medicine

## 2015-05-17 VITALS — BP 134/84 | Temp 98.4°F | Wt 169.0 lb

## 2015-05-17 DIAGNOSIS — J302 Other seasonal allergic rhinitis: Secondary | ICD-10-CM | POA: Diagnosis not present

## 2015-05-17 DIAGNOSIS — H1013 Acute atopic conjunctivitis, bilateral: Secondary | ICD-10-CM

## 2015-05-17 DIAGNOSIS — Z79899 Other long term (current) drug therapy: Secondary | ICD-10-CM

## 2015-05-17 DIAGNOSIS — F909 Attention-deficit hyperactivity disorder, unspecified type: Secondary | ICD-10-CM

## 2015-05-17 DIAGNOSIS — J309 Allergic rhinitis, unspecified: Secondary | ICD-10-CM

## 2015-05-17 DIAGNOSIS — F988 Other specified behavioral and emotional disorders with onset usually occurring in childhood and adolescence: Secondary | ICD-10-CM

## 2015-05-17 MED ORDER — AMPHETAMINE-DEXTROAMPHET ER 25 MG PO CP24: 25.0000 mg | ORAL_CAPSULE | ORAL | Status: DC

## 2015-05-17 MED ORDER — AZELASTINE HCL 0.05 % OP SOLN: 1.0000 [drp] | Freq: Two times a day (BID) | OPHTHALMIC | Status: DC

## 2015-05-17 NOTE — Progress Notes (Signed)
Pre visit review using our clinic review tool, if applicable. No additional management support is needed unless otherwise documented below in the visit note.  Chief Complaint  Patient presents with  . Follow-up    allergy meds  . ADHD    HPI: Jonathan [name] 35 y.o. comes in for med eval and FU   Had gi colon and cards eval since last visit for gatigue   Neg eval  Felt could have been  ? Heat and hydration . Exercise not as much but lots of better .  To ramp up soon intensity.   ADHD: taking adderall   "As i have always"   Every day  In am 7 30    Weekend ocass.  Still helps.    alelrgy drops helped last year  Feeling sx early please refill and used otc nasal spray incs and antihistamine. ROS: See pertinent positives and negatives per HPI. No cough wheeze  Bleed  return  Past Medical History  Diagnosis Date  . ADD (attention deficit disorder)   . IBS (irritable bowel syndrome)   . Allergy   . Asthma     hx consult Dr Annamaria Boots.    Family History  Problem Relation Age of Onset  . Hyperlipidemia Father   . Colon cancer Neg Hx   . Esophageal cancer Neg Hx   . Rectal cancer Neg Hx   . Stomach cancer Neg Hx     Social History   Social History  . Marital Status: Single    Spouse Name: N/A  . Number of Children: N/A  . Years of Education: N/A   Social History Main Topics  . Smoking status: Never Smoker   . Smokeless tobacco: Current User    Types: Chew    Last Attempt to Quit: 12/04/2014  . Alcohol Use: 0.0 oz/week    0 Standard drinks or equivalent per week     Comment: socially  . Drug Use: No  . Sexual Activity: Not Asked   Other Topics Concern  . 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    No pets   Regular exercise- yes runner 2 x per week  Lift 2 x per week    No ets.        Outpatient Prescriptions Prior to Visit  Medication Sig  Dispense Refill  . amphetamine-dextroamphetamine (ADDERALL XR) 25 MG 24 hr capsule Take 1 capsule by mouth every morning. 30 capsule 0  . azelastine (OPTIVAR) 0.05 % ophthalmic solution Place 1 drop into both eyes 2 (two) times daily. (Patient not taking: Reported on 05/17/2015) 6 mL 3   No facility-administered medications prior to visit.     EXAM:  BP 134/84 mmHg  Temp(Src) 98.4 F (36.9 C) (Oral)  Wt 169 lb (76.658 kg)  Body mass index is 24.61 kg/(m^2).  GENERAL: vitals reviewed and listed above, alert, oriented, appears well hydrated and in no acute distress HEENT: atraumatic, conjunctiva  clear, no obvious abnormalities on inspection of external nose and ears NECK: no obvious masses on inspection palpation  LUNGS: clear to auscultation bilaterally, no wheezes, rales or rhonchi, good air movement CV: HRRR, no clubbing cyanosis or  peripheral edema nl cap refill  MS: moves all extremities without noticeable focal  abnormality PSYCH: pleasant and cooperative, no obvious depression or  anxiety  Lab Results  Component Value Date   WBC 4.3 11/13/2014   HGB 14.9 11/13/2014   HCT 43.5 11/13/2014   PLT 216.0 11/13/2014   GLUCOSE 88 11/13/2014   CHOL 203* 06/01/2014   TRIG 168.0* 06/01/2014   HDL 58.80 06/01/2014   LDLCALC 111* 06/01/2014   ALT 23 11/13/2014   AST 26 11/13/2014   NA 140 11/13/2014   K 4.9 11/13/2014   CL 103 11/13/2014   CREATININE 0.96 11/13/2014   BUN 19 11/13/2014   CO2 32 11/13/2014   TSH 1.90 11/13/2014    .ASSESSMENT AND PLAN:  Discussed the following assessment and plan:  Attention deficit disorder  Medication management  Allergic conjunctivitis and rhinitis, bilateral  Allergic rhinitis, seasonal reviewed  rx and suppression of allergy sx  In season -Patient advised to return or notify health care team  if symptoms worsen ,persist or new concerns arise. tox screen due next visit .  cpx     Expectant management.   Patient Instructions    Continue   Continue lifestyle intervention healthy eating and exercise . Benefit more than risk of medications  to continue. Will send in  Eye drops  Nasal cortisone   Every day in season.       Standley Brooking. Kaylei Frink M.D.

## 2015-05-17 NOTE — Patient Instructions (Signed)
Continue   Continue lifestyle intervention healthy eating and exercise . Benefit more than risk of medications  to continue. Will send in  Eye drops  Nasal cortisone   Every day in season.

## 2015-06-09 ENCOUNTER — Encounter: Payer: Self-pay | Admitting: Internal Medicine

## 2015-08-16 ENCOUNTER — Other Ambulatory Visit: Payer: Self-pay | Admitting: Internal Medicine

## 2015-08-16 MED ORDER — AMPHETAMINE-DEXTROAMPHET ER 25 MG PO CP24: 25.0000 mg | ORAL_CAPSULE | ORAL | Status: DC

## 2015-08-16 NOTE — Telephone Encounter (Signed)
Pt needs new rx generic adderall xr 25 mg °

## 2015-08-16 NOTE — Telephone Encounter (Signed)
Rx ready for pick up and Left message on machine for patient to schedule an appointment for more refills.

## 2015-08-16 NOTE — Telephone Encounter (Signed)
Ok to refill 90 days   Printed today. Tell him he will need   Ov   Before next refill and get on scedule

## 2015-09-21 DIAGNOSIS — D225 Melanocytic nevi of trunk: Secondary | ICD-10-CM | POA: Diagnosis not present

## 2015-09-21 DIAGNOSIS — D1800 Hemangioma unspecified site: Secondary | ICD-10-CM | POA: Diagnosis not present

## 2015-09-21 DIAGNOSIS — L821 Other seborrheic keratosis: Secondary | ICD-10-CM | POA: Diagnosis not present

## 2015-09-21 DIAGNOSIS — Z86018 Personal history of other benign neoplasm: Secondary | ICD-10-CM | POA: Diagnosis not present

## 2015-10-29 ENCOUNTER — Other Ambulatory Visit (INDEPENDENT_AMBULATORY_CARE_PROVIDER_SITE_OTHER): Payer: BLUE CROSS/BLUE SHIELD

## 2015-10-29 DIAGNOSIS — Z0000 Encounter for general adult medical examination without abnormal findings: Secondary | ICD-10-CM | POA: Diagnosis not present

## 2015-10-29 LAB — BASIC METABOLIC PANEL
BUN: 15 mg/dL (ref 6–23)
CHLORIDE: 103 meq/L (ref 96–112)
CO2: 30 mEq/L (ref 19–32)
CREATININE: 0.97 mg/dL (ref 0.40–1.50)
Calcium: 9.5 mg/dL (ref 8.4–10.5)
GFR: 92.99 mL/min (ref >60.00)
GLUCOSE: 78 mg/dL (ref 70–99)
POTASSIUM: 3.8 meq/L (ref 3.5–5.1)
Sodium: 140 mEq/L (ref 135–145)

## 2015-10-29 LAB — CBC WITH DIFFERENTIAL/PLATELET
BASOS PCT: 0.5 % (ref 0.0–3.0)
Basophils Absolute: 0 10*3/uL (ref 0.0–0.1)
EOS ABS: 0.1 10*3/uL (ref 0.0–0.7)
Eosinophils Relative: 2.2 % (ref 0.0–5.0)
HEMATOCRIT: 42.3 % (ref 39.0–52.0)
Hemoglobin: 14.5 g/dL (ref 13.0–17.0)
LYMPHS ABS: 1.8 10*3/uL (ref 0.7–4.0)
LYMPHS PCT: 36.9 % (ref 12.0–46.0)
MCHC: 34.3 g/dL (ref 30.0–36.0)
MCV: 92.3 fl (ref 78.0–100.0)
Monocytes Absolute: 0.4 10*3/uL (ref 0.1–1.0)
Monocytes Relative: 9 % (ref 3.0–12.0)
NEUTROS ABS: 2.5 10*3/uL (ref 1.4–7.7)
Neutrophils Relative %: 51.4 % (ref 43.0–77.0)
PLATELETS: 245 10*3/uL (ref 150.0–400.0)
RBC: 4.59 Mil/uL (ref 4.22–5.81)
RDW: 13 % (ref 11.5–15.5)
WBC: 4.8 10*3/uL (ref 4.0–10.5)

## 2015-10-29 LAB — LIPID PANEL
CHOLESTEROL: 195 mg/dL (ref 0–200)
HDL: 58.8 mg/dL (ref >39.00)
LDL CALC: 113 mg/dL — AB (ref 0–99)
NonHDL: 136.24
TRIGLYCERIDES: 117 mg/dL (ref 0.0–149.0)
Total CHOL/HDL Ratio: 3
VLDL: 23.4 mg/dL (ref 0.0–40.0)

## 2015-10-29 LAB — HEPATIC FUNCTION PANEL
ALBUMIN: 4.4 g/dL (ref 3.5–5.2)
ALT: 20 U/L (ref 0–53)
AST: 21 U/L (ref 0–37)
Alkaline Phosphatase: 75 U/L (ref 39–117)
BILIRUBIN TOTAL: 0.5 mg/dL (ref 0.2–1.2)
Bilirubin, Direct: 0.1 mg/dL (ref 0.0–0.3)
Total Protein: 7.3 g/dL (ref 6.0–8.3)

## 2015-10-29 LAB — TSH: TSH: 2.18 u[IU]/mL (ref 0.35–4.50)

## 2015-11-04 NOTE — Progress Notes (Signed)
Pre visit review using our clinic review tool, if applicable. No additional management support is needed unless otherwise documented below in the visit note.  Chief Complaint  Patient presents with  . Annual Exam    HPI: Patient  Jonathan [name]  35 y.o. comes in today for [place] visit  Med check adhd   Medication   usuually every day in am around 7 weekends ome and   If misses  Dec   Lazy   Basically .    orginal  No mva  Sees derm and no recnet abnomal moles Asthma quiescent and nosx   Health Maintenance  Topic Date Due  . HIV Screening  08/31/1994  . INFLUENZA VACCINE  10/26/2015  . TETANUS/TDAP  07/18/2020   Health Maintenance Review LIFESTYLE:  Exercise:  trarining  For 1/2 marathon   4 days per week  Tobacco/ETS:no Alcohol: 5- 10 per week .  Sugar beverages:   ocass  Sleep:7.5  Dog  Drug use: no   hh of 1   Working  [employer] 40 - 50     ROS:  GEN/ HEENT: No fever, significant weight changes sweats headaches vision problems hearing changes, CV/ PULM; No chest pain shortness of breath cough, syncope,edema  change in exercise tolerance. GI /GU: No adominal pain, vomiting, change in bowel habits. No blood in the stool. No significant GU symptoms. SKIN/HEME: ,no acute skin rashes suspicious lesions or bleeding. No lymphadenopathy, nodules, masses.  NEURO/ PSYCH:  No neurologic signs such as weakness numbness. No depression anxiety. IMM/ Allergy: No unusual infections.  Allergy .   REST of 12 system review negative except as per HPI   Past Medical History:  Diagnosis Date  . ADD (attention deficit disorder)   . Allergy   . Asthma    hx consult Dr Annamaria Boots.  . IBS (irritable bowel syndrome)     Past Surgical History:  Procedure Laterality Date  . UPPER GASTROINTESTINAL ENDOSCOPY     eval by GI neg  . WRIST SURGERY     right- March 2016    Family History  Problem Relation Age of Onset  . Hyperlipidemia Father   . Colon cancer Neg Hx   .  Esophageal cancer Neg Hx   . Rectal cancer Neg Hx   . Stomach cancer Neg Hx     Social History   Social History  . Marital status: Single    Spouse name: N/A  . Number of children: N/A  . Years of education: N/A   Social History Main Topics  . Smoking status: Never Smoker  . Smokeless tobacco: Current User    Types: Chew    Last attempt to quit: 12/04/2014  . Alcohol use 0.0 oz/week     Comment: socially  . Drug use: No  . Sexual activity: Not Asked   Other Topics Concern  . 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    No pets   Regular exercise- yes runner 2 x per week  Lift 2 x per week    No ets.        Outpatient Medications Prior to Visit  Medication Sig Dispense Refill  . amphetamine-dextroamphetamine (ADDERALL XR) 25 MG 24 hr capsule Take 1 capsule by mouth every morning. 30 capsule 0  . amphetamine-dextroamphetamine (  ADDERALL XR) 25 MG 24 hr capsule Take 1 capsule by mouth every morning. 30 capsule 0  . amphetamine-dextroamphetamine (ADDERALL XR) 25 MG 24 hr capsule Take 1 capsule by mouth every morning. 30 capsule 0  . azelastine (OPTIVAR) 0.05 % ophthalmic solution Place 1 drop into both eyes 2 (two) times daily. (Patient not taking: Reported on 11/05/2015) 6 mL 3   No facility-administered medications prior to visit.      EXAM:  BP 130/72 (BP Location: Right Arm, Patient Position: Sitting, Cuff Size: Normal)   Temp 97.6 F (36.4 C) (Oral)   Ht 5' 9.5" (1.765 m)   Wt 165 lb 3.2 oz (74.9 kg)   BMI 24.05 kg/m   Body mass index is 24.05 kg/m.  Physical Exam: Vital signs reviewed [id number] is a well-developed well-nourished alert cooperative    who appearsr stated age in no acute distress.  HEENT: normocephalic atraumatic , Eyes: PERRL EOM's full, conjunctiva clear, Nares: paten,t no deformity discharge or tenderness., Ears: no deformity  EAC's clear TMs with normal landmarks. Mouth: clear OP, no lesions, edema.  Moist mucous membranes. Dentition in adequate repair. NECK: supple without masses, thyromegaly or bruits. CHEST/PULM:  Clear to auscultation and percussion breath sounds equal no wheeze , rales or rhonchi. No chest wall deformities or tenderness. CV: PMI is nondisplaced, S1 S2 no gallops, murmurs, rubs. Peripheral pulses are full without delay.No JVD .  ABDOMEN: Bowel sounds normal nontender  No guard or rebound, no hepato splenomegal no CVA tenderness.  No hernia. Extremtities:  No clubbing cyanosis or edema, no acute joint swelling or redness no focal atrophy NEURO:  Oriented x3, cranial nerves 3-12 appear to be intact, no obvious focal weakness,gait within normal limits no abnormal reflexes or asymmetrical SKIN: No acute rashes normal turgor, color, no bruising or petechiae. PSYCH: Oriented, good eye contact, no obvious depression anxiety, cognition and judgment appear normal. LN: no cervical axillary inguinal adenopathy  Lab Results  Component Value Date   WBC 4.8 10/29/2015   HGB 14.5 10/29/2015   HCT 42.3 10/29/2015   PLT 245.0 10/29/2015   GLUCOSE 78 10/29/2015   CHOL 195 10/29/2015   TRIG 117.0 10/29/2015   HDL 58.80 10/29/2015   LDLCALC 113 (H) 10/29/2015   ALT 20 10/29/2015   AST 21 10/29/2015   NA 140 10/29/2015   K 3.8 10/29/2015   CL 103 10/29/2015   CREATININE 0.97 10/29/2015   BUN 15 10/29/2015   CO2 30 10/29/2015   TSH 2.18 10/29/2015    ASSESSMENT AND PLAN:  Discussed the following assessment and plan:  Visit for preventive health examination  Attention deficit disorder  Medication management tox screen today   Adequate response to med .  No Side effect ;decrease appetite ,acceptable .Continue at same dose . ROV in  6 months for med check.   Patient Care Team: Burnis Medin, MD as PCP - General Crista Luria, MD as Attending Physician (Dermatology) Patient Instructions    Continue lifestyle intervention healthy eating and exercise .  Healthy lifestyle includes : At least 150 minutes of exercise weeks  , weight at healthy levels, which is usually   BMI 19-25. Avoid trans fats and processed foods;  Increase fresh fruits and veges to 5 servings per day. And avoid sweet beverages including tea and juice. Mediterranean diet with olive oil and nuts have been noted to be heart and brain healthy . Avoid tobacco products . Limit  alcohol to  7 per week for women and  14 servings for men.  Get adequate sleep . Wear seat belts . Don't text and drive .            Standley Brooking. Panosh M.D.

## 2015-11-05 ENCOUNTER — Encounter: Payer: Self-pay | Admitting: Internal Medicine

## 2015-11-05 ENCOUNTER — Ambulatory Visit (INDEPENDENT_AMBULATORY_CARE_PROVIDER_SITE_OTHER): Payer: BLUE CROSS/BLUE SHIELD | Admitting: Internal Medicine

## 2015-11-05 VITALS — BP 130/72 | Temp 97.6°F | Ht 69.5 in | Wt 165.2 lb

## 2015-11-05 DIAGNOSIS — Z79899 Other long term (current) drug therapy: Secondary | ICD-10-CM | POA: Diagnosis not present

## 2015-11-05 DIAGNOSIS — Z0000 Encounter for general adult medical examination without abnormal findings: Secondary | ICD-10-CM | POA: Diagnosis not present

## 2015-11-05 DIAGNOSIS — Z79891 Long term (current) use of opiate analgesic: Secondary | ICD-10-CM | POA: Diagnosis not present

## 2015-11-05 DIAGNOSIS — F909 Attention-deficit hyperactivity disorder, unspecified type: Secondary | ICD-10-CM

## 2015-11-05 DIAGNOSIS — F988 Other specified behavioral and emotional disorders with onset usually occurring in childhood and adolescence: Secondary | ICD-10-CM

## 2015-11-05 MED ORDER — AMPHETAMINE-DEXTROAMPHET ER 25 MG PO CP24: 25.0000 mg | ORAL_CAPSULE | ORAL | 0 refills | Status: DC

## 2015-11-05 NOTE — Patient Instructions (Addendum)
Continue lifestyle intervention healthy eating and exercise . Healthy lifestyle includes : At least 150 minutes of exercise weeks  , weight at healthy levels, which is usually   BMI 19-25. Avoid trans fats and processed foods;  Increase fresh fruits and veges to 5 servings per day. And avoid sweet beverages including tea and juice. Mediterranean diet with olive oil and nuts have been noted to be heart and brain healthy . Avoid tobacco products . Limit  alcohol to  7 per week for women and 14 servings for men.  Get adequate sleep . Wear seat belts . Don't text and drive .    

## 2015-11-05 NOTE — Assessment & Plan Note (Signed)
utd labs reviewed continue healthy life stype

## 2015-11-05 NOTE — Assessment & Plan Note (Addendum)
Benefit more than risk of medications  to continue. tox screen today

## 2016-02-14 ENCOUNTER — Telehealth: Payer: Self-pay | Admitting: Internal Medicine

## 2016-02-14 MED ORDER — AMPHETAMINE-DEXTROAMPHET ER 25 MG PO CP24: 25.0000 mg | ORAL_CAPSULE | ORAL | 0 refills | Status: DC

## 2016-02-14 NOTE — Telephone Encounter (Signed)
Pt request refill  amphetamine-dextroamphetamine (ADDERALL XR) 25 MG 24  3 mo supply

## 2016-02-14 NOTE — Telephone Encounter (Signed)
Ok x 3 months     Ov before further refills

## 2016-02-14 NOTE — Telephone Encounter (Signed)
Printed for WP to sign. 

## 2016-02-14 NOTE — Telephone Encounter (Signed)
Left a message for the pt to pick up at the front desk.  Has follow up scheduled for 05/09/16.

## 2016-03-24 DIAGNOSIS — H5202 Hypermetropia, left eye: Secondary | ICD-10-CM | POA: Diagnosis not present

## 2016-05-08 NOTE — Progress Notes (Signed)
Pre visit review using our clinic review tool, if applicable. No additional management support is needed unless otherwise documented below in the visit note.  Chief Complaint  Patient presents with  . Follow-up    HPI: Jonathan [name] 36 y.o. come in for Chronic disease management  More specifically medicine for ADHD. Continue with his Adderall takes it almost every day occasionally off on a weekend. He still says it helps him focus and get motivated to get things done Denies any significant side effects he has been on this medicine for quite a while. Rest of his health is good hasn't been exercising as much recently because of the weather. When discussing blood pressure right readings his father had hypertension and was on medicine in his 53s. He tries to eat heart healthy ways in a running club. Not using tobacco significant alcohol except socially negative RD. ROS: See pertinent positives and negatives per HPI.  Past Medical History:  Diagnosis Date  . ADD (attention deficit disorder)   . Allergy   . Asthma    hx consult Dr Annamaria Boots.  . IBS (irritable bowel syndrome)     Family History  Problem Relation Age of Onset  . Hyperlipidemia Father   . Colon cancer Neg Hx   . Esophageal cancer Neg Hx   . Rectal cancer Neg Hx   . Stomach cancer Neg Hx     Social History   Social History  . Marital status: Single    Spouse name: N/A  . Number of children: N/A  . Years of education: N/A   Social History Main Topics  . Smoking status: Never Smoker  . Smokeless tobacco: Current User    Types: Chew    Last attempt to quit: 12/04/2014  . Alcohol use 0.0 oz/week     Comment: socially  . Drug use: No  . Sexual activity: Not Asked   Other Topics Concern  . 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    No pets   Regular exercise- yes runner 2 x per week  Lift 2  x per week    No ets.        Outpatient Medications Prior to Visit  Medication Sig Dispense Refill  . amphetamine-dextroamphetamine (ADDERALL XR) 25 MG 24 hr capsule Take 1 capsule by mouth every morning. 30 capsule 0  . amphetamine-dextroamphetamine (ADDERALL XR) 25 MG 24 hr capsule Take 1 capsule by mouth every morning. 30 capsule 0  . amphetamine-dextroamphetamine (ADDERALL XR) 25 MG 24 hr capsule Take 1 capsule by mouth every morning. 30 capsule 0  . azelastine (OPTIVAR) 0.05 % ophthalmic solution Place 1 drop into both eyes 2 (two) times daily. (Patient not taking: Reported on 11/05/2015) 6 mL 3   No facility-administered medications prior to visit.      EXAM:  BP 126/90 (BP Location: Right Arm, Patient Position: Sitting, Cuff Size: Normal)   Temp 97.4 F (36.3 C) (Oral)   Wt 169 lb (76.7 kg)   BMI 24.60 kg/m   Body mass index is 24.6 kg/m. Repeat blood pressure 124/86. GENERAL: vitals reviewed and listed above, alert, oriented, appears well hydrated and in no acute distress HEENT: atraumatic, conjunctiva  clear, no obvious abnormalities on inspection of external nose and ears MS: moves all extremities without noticeable focal  abnormality  PSYCH: pleasant and cooperative, no obvious depression or anxiety Lab Results  Component Value Date   WBC 4.8 10/29/2015   HGB 14.5 10/29/2015   HCT 42.3 10/29/2015   PLT 245.0 10/29/2015   GLUCOSE 78 10/29/2015   CHOL 195 10/29/2015   TRIG 117.0 10/29/2015   HDL 58.80 10/29/2015   LDLCALC 113 (H) 10/29/2015   ALT 20 10/29/2015   AST 21 10/29/2015   NA 140 10/29/2015   K 3.8 10/29/2015   CL 103 10/29/2015   CREATININE 0.97 10/29/2015   BUN 15 10/29/2015   CO2 30 10/29/2015   TSH 2.18 10/29/2015   BP Readings from Last 3 Encounters:  05/09/16 126/90  11/05/15 130/72  05/17/15 134/84    ASSESSMENT AND PLAN:  Discussed the following assessment and plan:  Attention deficit disorder, unspecified hyperactivity presence -  Benefit continues more than risk continue refill 3 months. R OV CPX 6 months  Medication management  Elevated BP without diagnosis of hypertension - mild fam hx  disc  lsi prevention Last uds 8 17  We'll follow blood pressure readings to be a spurious outlier continue his healthy exercise formation given on Dash eating. CPX with labs in about 6 months. Or as needed. -Patient advised to return or notify health care team  if  new concerns arise.  Patient Instructions  bp goal  120/80  Or at least 130/80 is healthiest .   Deliah Boston eating  Is healthy.   Cpx  In 6 months   DASH Eating Plan DASH stands for "Dietary Approaches to Stop Hypertension." The DASH eating plan is a healthy eating plan that has been shown to reduce high blood pressure (hypertension). Additional health benefits may include reducing the risk of type 2 diabetes mellitus, heart disease, and stroke. The DASH eating plan may also help with weight loss. What do I need to know about the DASH eating plan? For the DASH eating plan, you will follow these general guidelines:  Choose foods with less than 150 milligrams of sodium per serving (as listed on the food label).  Use salt-free seasonings or herbs instead of table salt or sea salt.  Check with your health care provider or pharmacist before using salt substitutes.  Eat lower-sodium products. These are often labeled as "low-sodium" or "no salt added."  Eat fresh foods. Avoid eating a lot of canned foods.  Eat more vegetables, fruits, and low-fat dairy products.  Choose whole grains. Look for the word "whole" as the first word in the ingredient list.  Choose fish and skinless chicken or [place] more often than red meat. Limit fish, poultry, and meat to 6 oz (170 g) each day.  Limit sweets, desserts, sugars, and sugary drinks.  Choose heart-healthy fats.  Eat more home-cooked food and less restaurant, buffet, and fast food.  Limit fried foods.  Do not fry foods.  Cook foods using methods such as baking, boiling, grilling, and broiling instead.  When eating at a restaurant, ask that your food be prepared with less salt, or no salt if possible. What foods can I eat? Seek help from a dietitian for individual calorie needs. Grains  Whole grain or whole wheat bread. Brown rice. Whole grain or whole wheat pasta. Quinoa, bulgur, and whole grain cereals. Low-sodium cereals. Corn or whole wheat flour tortillas. Whole grain cornbread. Whole grain crackers. Low-sodium crackers. Vegetables  Fresh or frozen vegetables (raw, steamed, roasted, or grilled). Low-sodium or reduced-sodium tomato and vegetable juices. Low-sodium or reduced-sodium tomato sauce and  paste. Low-sodium or reduced-sodium canned vegetables. Fruits  All fresh, canned (in natural juice), or frozen fruits. Meat and Other Protein Products  Ground beef (85% or leaner), grass-fed beef, or beef trimmed of fat. Skinless chicken or [place]. Ground chicken or [place]. Pork trimmed of fat. All fish and seafood. Eggs. Dried beans, peas, or lentils. Unsalted nuts and seeds. Unsalted canned beans. Dairy  Low-fat dairy products, such as skim or 1% milk, 2% or reduced-fat cheeses, low-fat ricotta or cottage cheese, or plain low-fat yogurt. Low-sodium or reduced-sodium cheeses. Fats and Oils  Tub margarines without trans fats. Light or reduced-fat mayonnaise and salad dressings (reduced sodium). Avocado. Safflower, olive, or canola oils. Natural peanut or almond butter. Other  Unsalted popcorn and pretzels. The items listed above may not be a complete list of recommended foods or beverages. Contact your dietitian for more options.  What foods are not recommended? Grains  White bread. White pasta. White rice. Refined cornbread. Bagels and croissants. Crackers that contain trans fat. Vegetables  Creamed or fried vegetables. Vegetables in a cheese sauce. Regular canned vegetables. Regular canned tomato sauce and  paste. Regular tomato and vegetable juices. Fruits  Canned fruit in light or heavy syrup. Fruit juice. Meat and Other Protein Products  Fatty cuts of meat. Ribs, chicken wings, bacon, sausage, bologna, salami, chitterlings, fatback, hot dogs, bratwurst, and packaged luncheon meats. Salted nuts and seeds. Canned beans with salt. Dairy  Whole or 2% milk, cream, half-and-half, and cream cheese. Whole-fat or sweetened yogurt. Full-fat cheeses or blue cheese. Nondairy creamers and whipped toppings. Processed cheese, cheese spreads, or cheese curds. Condiments  Onion and garlic salt, seasoned salt, table salt, and sea salt. Canned and packaged gravies. Worcestershire sauce. Tartar sauce. Barbecue sauce. Teriyaki sauce. Soy sauce, including reduced sodium. Steak sauce. Fish sauce. Oyster sauce. Cocktail sauce. Horseradish. Ketchup and mustard. Meat flavorings and tenderizers. Bouillon cubes. Hot sauce. Tabasco sauce. Marinades. Taco seasonings. Relishes. Fats and Oils  Butter, stick margarine, lard, shortening, ghee, and bacon fat. Coconut, palm kernel, or palm oils. Regular salad dressings. Other  Pickles and olives. Salted popcorn and pretzels. The items listed above may not be a complete list of foods and beverages to avoid. Contact your dietitian for more information.  Where can I find more information? National Heart, Lung, and Blood Institute: travelstabloid.com This information is not intended to replace advice given to you by your health care provider. Make sure you discuss any questions you have with your health care provider. Document Released: 03/02/2011 Document Revised: 08/19/2015 Document Reviewed: 01/15/2013 Elsevier Interactive Patient Education  2017 [place] K. Panosh M.D.

## 2016-05-09 ENCOUNTER — Encounter: Payer: Self-pay | Admitting: Internal Medicine

## 2016-05-09 ENCOUNTER — Ambulatory Visit (INDEPENDENT_AMBULATORY_CARE_PROVIDER_SITE_OTHER): Payer: BLUE CROSS/BLUE SHIELD | Admitting: Internal Medicine

## 2016-05-09 VITALS — BP 126/90 | Temp 97.4°F | Wt 169.0 lb

## 2016-05-09 DIAGNOSIS — Z79899 Other long term (current) drug therapy: Secondary | ICD-10-CM

## 2016-05-09 DIAGNOSIS — F988 Other specified behavioral and emotional disorders with onset usually occurring in childhood and adolescence: Secondary | ICD-10-CM

## 2016-05-09 DIAGNOSIS — R030 Elevated blood-pressure reading, without diagnosis of hypertension: Secondary | ICD-10-CM | POA: Diagnosis not present

## 2016-05-09 MED ORDER — AMPHETAMINE-DEXTROAMPHET ER 25 MG PO CP24: 25.0000 mg | ORAL_CAPSULE | ORAL | 0 refills | Status: DC

## 2016-05-09 NOTE — Patient Instructions (Signed)
bp goal  120/80  Or at least 130/80 is healthiest .   Jonathan [name] eating  Is healthy.   Cpx  In 6 months   DASH Eating Plan DASH stands for "Dietary Approaches to Stop Hypertension." The DASH eating plan is a healthy eating plan that has been shown to reduce high blood pressure (hypertension). Additional health benefits may include reducing the risk of type 2 diabetes mellitus, heart disease, and stroke. The DASH eating plan may also help with weight loss. What do I need to know about the DASH eating plan? For the DASH eating plan, you will follow these general guidelines:  Choose foods with less than 150 milligrams of sodium per serving (as listed on the food label).  Use salt-free seasonings or herbs instead of table salt or sea salt.  Check with your health care provider or pharmacist before using salt substitutes.  Eat lower-sodium products. These are often labeled as "low-sodium" or "no salt added."  Eat fresh foods. Avoid eating a lot of canned foods.  Eat more vegetables, fruits, and low-fat dairy products.  Choose whole grains. Look for the word "whole" as the first word in the ingredient list.  Choose fish and skinless chicken or [place] more often than red meat. Limit fish, poultry, and meat to 6 oz (170 g) each day.  Limit sweets, desserts, sugars, and sugary drinks.  Choose heart-healthy fats.  Eat more home-cooked food and less restaurant, buffet, and fast food.  Limit fried foods.  Do not fry foods. Cook foods using methods such as baking, boiling, grilling, and broiling instead.  When eating at a restaurant, ask that your food be prepared with less salt, or no salt if possible. What foods can I eat? Seek help from a dietitian for individual calorie needs. Grains  Whole grain or whole wheat bread. Brown rice. Whole grain or whole wheat pasta. Quinoa, bulgur, and whole grain cereals. Low-sodium cereals. Corn or whole wheat flour tortillas. Whole grain cornbread. Whole  grain crackers. Low-sodium crackers. Vegetables  Fresh or frozen vegetables (raw, steamed, roasted, or grilled). Low-sodium or reduced-sodium tomato and vegetable juices. Low-sodium or reduced-sodium tomato sauce and paste. Low-sodium or reduced-sodium canned vegetables. Fruits  All fresh, canned (in natural juice), or frozen fruits. Meat and Other Protein Products  Ground beef (85% or leaner), grass-fed beef, or beef trimmed of fat. Skinless chicken or [place]. Ground chicken or [place]. Pork trimmed of fat. All fish and seafood. Eggs. Dried beans, peas, or lentils. Unsalted nuts and seeds. Unsalted canned beans. Dairy  Low-fat dairy products, such as skim or 1% milk, 2% or reduced-fat cheeses, low-fat ricotta or cottage cheese, or plain low-fat yogurt. Low-sodium or reduced-sodium cheeses. Fats and Oils  Tub margarines without trans fats. Light or reduced-fat mayonnaise and salad dressings (reduced sodium). Avocado. Safflower, olive, or canola oils. Natural peanut or almond butter. Other  Unsalted popcorn and pretzels. The items listed above may not be a complete list of recommended foods or beverages. Contact your dietitian for more options.  What foods are not recommended? Grains  White bread. White pasta. White rice. Refined cornbread. Bagels and croissants. Crackers that contain trans fat. Vegetables  Creamed or fried vegetables. Vegetables in a cheese sauce. Regular canned vegetables. Regular canned tomato sauce and paste. Regular tomato and vegetable juices. Fruits  Canned fruit in light or heavy syrup. Fruit juice. Meat and Other Protein Products  Fatty cuts of meat. Ribs, chicken wings, bacon, sausage, bologna, salami, chitterlings, fatback, hot dogs, bratwurst, and  packaged luncheon meats. Salted nuts and seeds. Canned beans with salt. Dairy  Whole or 2% milk, cream, half-and-half, and cream cheese. Whole-fat or sweetened yogurt. Full-fat cheeses or blue cheese. Nondairy creamers and  whipped toppings. Processed cheese, cheese spreads, or cheese curds. Condiments  Onion and garlic salt, seasoned salt, table salt, and sea salt. Canned and packaged gravies. Worcestershire sauce. Tartar sauce. Barbecue sauce. Teriyaki sauce. Soy sauce, including reduced sodium. Steak sauce. Fish sauce. Oyster sauce. Cocktail sauce. Horseradish. Ketchup and mustard. Meat flavorings and tenderizers. Bouillon cubes. Hot sauce. Tabasco sauce. Marinades. Taco seasonings. Relishes. Fats and Oils  Butter, stick margarine, lard, shortening, ghee, and bacon fat. Coconut, palm kernel, or palm oils. Regular salad dressings. Other  Pickles and olives. Salted popcorn and pretzels. The items listed above may not be a complete list of foods and beverages to avoid. Contact your dietitian for more information.  Where can I find more information? National Heart, Lung, and Blood Institute: travelstabloid.com This information is not intended to replace advice given to you by your health care provider. Make sure you discuss any questions you have with your health care provider. Document Released: 03/02/2011 Document Revised: 08/19/2015 Document Reviewed: 01/15/2013 Elsevier Interactive Patient Education  2017 [employer].

## 2016-07-17 ENCOUNTER — Encounter: Payer: Self-pay | Admitting: Internal Medicine

## 2016-07-17 MED ORDER — AMPHETAMINE-DEXTROAMPHET ER 25 MG PO CP24: 25.0000 mg | ORAL_CAPSULE | ORAL | 0 refills | Status: DC

## 2016-07-17 MED ORDER — AZELASTINE HCL 0.05 % OP SOLN: 1.0000 [drp] | Freq: Two times a day (BID) | OPHTHALMIC | 3 refills | Status: DC

## 2016-07-17 NOTE — Telephone Encounter (Signed)
I printed refills for the adderall  Will send in  Eye drops  Refill Please notify patient

## 2016-09-21 DIAGNOSIS — Z86018 Personal history of other benign neoplasm: Secondary | ICD-10-CM | POA: Diagnosis not present

## 2016-09-21 DIAGNOSIS — D225 Melanocytic nevi of trunk: Secondary | ICD-10-CM | POA: Diagnosis not present

## 2016-09-21 DIAGNOSIS — D1800 Hemangioma unspecified site: Secondary | ICD-10-CM | POA: Diagnosis not present

## 2016-09-21 DIAGNOSIS — L821 Other seborrheic keratosis: Secondary | ICD-10-CM | POA: Diagnosis not present

## 2016-11-07 ENCOUNTER — Other Ambulatory Visit: Payer: Self-pay | Admitting: Emergency Medicine

## 2016-11-07 ENCOUNTER — Telehealth: Payer: Self-pay | Admitting: Internal Medicine

## 2016-11-07 MED ORDER — AMPHETAMINE-DEXTROAMPHET ER 25 MG PO CP24: 25.0000 mg | ORAL_CAPSULE | ORAL | 0 refills | Status: DC

## 2016-11-07 NOTE — Telephone Encounter (Signed)
Ok to refill for one month since Dr. Regis Bill is out

## 2016-11-07 NOTE — Telephone Encounter (Signed)
Pt needs new rx generic adderall xr 25 mg

## 2016-11-07 NOTE — Telephone Encounter (Signed)
Patient would like a refill. Last refill was 07/17/2016. Last OV 05/09/2016. Please advise. Thank you

## 2016-11-08 NOTE — Telephone Encounter (Signed)
Left a Vm for patient regarding prescription being ready for pick up

## 2016-11-13 ENCOUNTER — Other Ambulatory Visit: Payer: BLUE CROSS/BLUE SHIELD

## 2016-11-19 NOTE — Progress Notes (Signed)
Chief Complaint  Patient presents with  . Annual Exam    Pt states he has no complaints as of today    HPI: Patient  Jonathan [name]  36 y.o. comes in today for Jonathan [name] visit  And med check  ADHD :  Med  hleps concentration stay on task .    No se .     Takes on  Weekend. Also no sig se.  "Doesn't get any thing done if not taking med "    Health Maintenance  Topic Date Due  . HIV Screening  08/31/1994  . INFLUENZA VACCINE  10/25/2016  . TETANUS/TDAP  07/18/2020   Health Maintenance Review LIFESTYLE:  Exercise:  Ran half marathon.     And to go again in [place] 4 x per week  Tobacco/ETS: no Alcohol:  4-5 weekend  Sugar beverages: Sleep: 6-8  Drug use: no  HH of  1 dog  Work:  40 +    ROS:  GEN/ HEENT: No fever, significant weight changes sweats headaches vision problems hearing changes, CV/ PULM; No chest pain shortness of breath cough, syncope,edema  change in exercise tolerance. GI /GU: No adominal pain, vomiting, change in bowel habits. No blood in the stool. No significant GU symptoms. SKIN/HEME: ,no acute skin rashes suspicious lesions or bleeding. No lymphadenopathy, nodules, masses.  NEURO/ PSYCH:  No neurologic signs such as weakness numbness. X ocass med right arch  No depression anxiety. IMM/ Allergy: No unusual infections.  Allergy .   No asthma flair  REST of 12 system review negative except as per HPI   Past Medical History:  Diagnosis Date  . ADD (attention deficit disorder)   . Allergy   . Asthma    hx consult Dr Annamaria Boots.  . Blood in stool 11/13/2014   painless  ? not mixed in  and gi opinoin  r no chnag ein bowel habits    . IBS (irritable bowel syndrome)     Past Surgical History:  Procedure Laterality Date  . UPPER GASTROINTESTINAL ENDOSCOPY     eval by GI neg  . WRIST SURGERY     right- March 2016    Family History  Problem Relation Age of Onset  . Hyperlipidemia Father   . Colon cancer Neg Hx   . Esophageal cancer Neg  Hx   . Rectal cancer Neg Hx   . Stomach cancer Neg Hx     Social History   Social History  . Marital status: Single    Spouse name: N/A  . Number of children: N/A  . Years of education: N/A   Social History Main Topics  . Smoking status: Never Smoker  . Smokeless tobacco: Current User    Types: Chew    Last attempt to quit: 12/04/2014  . Alcohol use 0.0 oz/week     Comment: socially  . Drug use: No  . Sexual activity: Not Asked   Other Topics Concern  . 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    No pets   Regular exercise- yes runner 2 x per week  Lift 2 x per week    No ets.        Outpatient Medications Prior to Visit  Medication Sig Dispense Refill  . azelastine (OPTIVAR) 0.05 %  ophthalmic solution Place 1 drop into both eyes 2 (two) times daily. 6 mL 3  . amphetamine-dextroamphetamine (ADDERALL XR) 25 MG 24 hr capsule Take 1 capsule by mouth every morning. 30 capsule 0  . amphetamine-dextroamphetamine (ADDERALL XR) 25 MG 24 hr capsule Take 1 capsule by mouth every morning. 30 capsule 0  . amphetamine-dextroamphetamine (ADDERALL XR) 25 MG 24 hr capsule Take 1 capsule by mouth every morning. 30 capsule 0   No facility-administered medications prior to visit.      EXAM:  BP 122/78 (BP Location: Right Arm, Patient Position: Sitting, Cuff Size: Normal)   Pulse 90   Temp 98.2 F (36.8 C) (Oral)   Ht 5' 9.5" (1.765 m)   Wt 172 lb 12.8 oz (78.4 kg)   SpO2 98%   BMI 25.15 kg/m   Body mass index is 25.15 kg/m. Wt Readings from Last 3 Encounters:  11/20/16 172 lb 12.8 oz (78.4 kg)  05/09/16 169 lb (76.7 kg)  11/05/15 165 lb 3.2 oz (74.9 kg)    Physical Exam: Vital signs reviewed [facility]:NOBS is a well-developed well-nourished alert cooperative    who appearsr stated age in no acute distress.  HEENT: normocephalic atraumatic , Eyes: PERRL EOM's  full, conjunctiva clear, Nares: paten,t no deformity discharge or tenderness., Ears: no deformity EAC's clear TMs with normal landmarks. Mouth: clear OP, no lesions, edema.  Moist mucous membranes. Dentition in adequate repair. NECK: supple without masses, thyromegaly or bruits. CHEST/PULM:  Clear to auscultation and percussion breath sounds equal no wheeze , rales or rhonchi. No chest wall deformities or tenderness. CV: PMI is nondisplaced, S1 S2 no gallops, murmurs, rubs. Peripheral pulses are full without delay.No JVD .  ABDOMEN: Bowel sounds normal nontender  No guard or rebound, no hepato splenomegal no CVA tenderness.   Extremtities:  No clubbing cyanosis or edema, no acute joint swelling or redness no focal atrophy NEURO:  Oriented x3, cranial nerves 3-12 appear to be intact, no obvious focal weakness,gait within normal limits no abnormal reflexes or asymmetrical SKIN: No acute rashes normal turgor, color, no bruising or petechiae. Moles   PSYCH: Oriented, good eye contact, no obvious depression anxiety, cognition and judgment appear normal. LN: no cervical axillaryadenopathy  Lab Results  Component Value Date   WBC 4.8 10/29/2015   HGB 14.5 10/29/2015   HCT 42.3 10/29/2015   PLT 245.0 10/29/2015   GLUCOSE 78 10/29/2015   CHOL 195 10/29/2015   TRIG 117.0 10/29/2015   HDL 58.80 10/29/2015   LDLCALC 113 (H) 10/29/2015   ALT 20 10/29/2015   AST 21 10/29/2015   NA 140 10/29/2015   K 3.8 10/29/2015   CL 103 10/29/2015   CREATININE 0.97 10/29/2015   BUN 15 10/29/2015   CO2 30 10/29/2015   TSH 2.18 10/29/2015    BP Readings from Last 3 Encounters:  11/20/16 122/78  05/09/16 126/90  11/05/15 130/72    Lab results reviewed with patient   ASSESSMENT AND PLAN:  Discussed the following assessment and plan:  Visit for preventive health examination  Attention deficit disorder, unspecified hyperactivity presence - me benefit mroe than risk  yealry UDS  today  Medication  management - Plan: Drug Abuse Panel 10-50, U, Drug Abuse Panel 10-50, U  Seasonal allergic rhinitis, unspecified trigger - stable tox screen today routine .   Gets screened at work for lipids other .  Not high risk at this time. Can get blood work next year.  Patient Care Team: Burnis Medin, MD  as PCP - General Crista Luria, MD as Attending Physician (Dermatology) Patient Instructions   Continue lifestyle intervention healthy eating and exercise . ROV  med check in 6 months    Preventive Care 18-39 Years, Male Preventive care refers to lifestyle choices and visits with your health care provider that can promote health and wellness. What does preventive care include?  A yearly physical exam. This is also called an annual well check.  Dental exams once or twice a year.  Routine eye exams. Ask your health care provider how often you should have your eyes checked.  Personal lifestyle choices, including: ? Daily care of your teeth and gums. ? Regular physical activity. ? Eating a healthy diet. ? Avoiding tobacco and drug use. ? Limiting alcohol use. ? Practicing safe sex. What happens during an annual well check? The services and screenings done by your health care provider during your annual well check will depend on your age, overall health, lifestyle risk factors, and family history of disease. Counseling Your health care provider may ask you questions about your:  Alcohol use.  Tobacco use.  Drug use.  Emotional well-being.  Home and relationship well-being.  Sexual activity.  Eating habits.  Work and work Statistician.  Screening You may have the following tests or measurements:  Height, weight, and BMI.  Blood pressure.  Lipid and cholesterol levels. These may be checked every 5 years starting at age 3.  Diabetes screening. This is done by checking your blood sugar (glucose) after you have not eaten for a while (fasting).  Skin check.  Hepatitis C  blood test.  Hepatitis B blood test.  Sexually transmitted disease (STD) testing.  Discuss your test results, treatment options, and if necessary, the need for more tests with your health care provider. Vaccines Your health care provider may recommend certain vaccines, such as:  Influenza vaccine. This is recommended every year.  Tetanus, diphtheria, and acellular pertussis (Tdap, Td) vaccine. You may need a Td booster every 10 years.  Varicella vaccine. You may need this if you have not been vaccinated.  HPV vaccine. If you are 33 or younger, you may need three doses over 6 months.  Measles, mumps, and rubella (MMR) vaccine. You may need at least one dose of MMR.You may also need a second dose.  Pneumococcal 13-valent conjugate (PCV13) vaccine. You may need this if you have certain conditions and have not been vaccinated.  Pneumococcal polysaccharide (PPSV23) vaccine. You may need one or two doses if you smoke cigarettes or if you have certain conditions.  Meningococcal vaccine. One dose is recommended if you are age 57-21 years and a first-year college student living in a residence hall, or if you have one of several medical conditions. You may also need additional booster doses.  Hepatitis A vaccine. You may need this if you have certain conditions or if you travel or work in places where you may be exposed to hepatitis A.  Hepatitis B vaccine. You may need this if you have certain conditions or if you travel or work in places where you may be exposed to hepatitis B.  Haemophilus influenzae type b (Hib) vaccine. You may need this if you have certain risk factors.  Talk to your health care provider about which screenings and vaccines you need and how often you need them. This information is not intended to replace advice given to you by your health care provider. Make sure you discuss any questions you have with your health care  provider. Document Released: 05/09/2001 Document  Revised: 12/01/2015 Document Reviewed: 01/12/2015 Elsevier Interactive Patient Education  2017 [employer]. Lilyona Richner M.D.

## 2016-11-20 ENCOUNTER — Ambulatory Visit (INDEPENDENT_AMBULATORY_CARE_PROVIDER_SITE_OTHER): Payer: BLUE CROSS/BLUE SHIELD | Admitting: Internal Medicine

## 2016-11-20 ENCOUNTER — Encounter: Payer: Self-pay | Admitting: Internal Medicine

## 2016-11-20 VITALS — BP 122/78 | HR 90 | Temp 98.2°F | Ht 69.5 in | Wt 172.8 lb

## 2016-11-20 DIAGNOSIS — F988 Other specified behavioral and emotional disorders with onset usually occurring in childhood and adolescence: Secondary | ICD-10-CM | POA: Diagnosis not present

## 2016-11-20 DIAGNOSIS — J302 Other seasonal allergic rhinitis: Secondary | ICD-10-CM | POA: Diagnosis not present

## 2016-11-20 DIAGNOSIS — Z0000 Encounter for general adult medical examination without abnormal findings: Secondary | ICD-10-CM | POA: Diagnosis not present

## 2016-11-20 DIAGNOSIS — Z79899 Other long term (current) drug therapy: Secondary | ICD-10-CM

## 2016-11-20 MED ORDER — AMPHETAMINE-DEXTROAMPHET ER 25 MG PO CP24: 25.0000 mg | ORAL_CAPSULE | ORAL | 0 refills | Status: DC

## 2016-11-20 MED ORDER — AMPHETAMINE-DEXTROAMPHET ER 25 MG PO CP24
25.0000 mg | ORAL_CAPSULE | ORAL | 0 refills | Status: DC
Start: 2016-11-20 — End: 2017-02-05

## 2016-11-20 NOTE — Patient Instructions (Addendum)
Continue lifestyle intervention healthy eating and exercise . ROV  med check in 6 months    Preventive Care 18-39 Years, Male Preventive care refers to lifestyle choices and visits with your health care provider that can promote health and wellness. What does preventive care include?  A yearly physical exam. This is also called an annual well check.  Dental exams once or twice a year.  Routine eye exams. Ask your health care provider how often you should have your eyes checked.  Personal lifestyle choices, including: ? Daily care of your teeth and gums. ? Regular physical activity. ? Eating a healthy diet. ? Avoiding tobacco and drug use. ? Limiting alcohol use. ? Practicing safe sex. What happens during an annual well check? The services and screenings done by your health care provider during your annual well check will depend on your age, overall health, lifestyle risk factors, and family history of disease. Counseling Your health care provider may ask you questions about your:  Alcohol use.  Tobacco use.  Drug use.  Emotional well-being.  Home and relationship well-being.  Sexual activity.  Eating habits.  Work and work Statistician.  Screening You may have the following tests or measurements:  Height, weight, and BMI.  Blood pressure.  Lipid and cholesterol levels. These may be checked every 5 years starting at age 35.  Diabetes screening. This is done by checking your blood sugar (glucose) after you have not eaten for a while (fasting).  Skin check.  Hepatitis C blood test.  Hepatitis B blood test.  Sexually transmitted disease (STD) testing.  Discuss your test results, treatment options, and if necessary, the need for more tests with your health care provider. Vaccines Your health care provider may recommend certain vaccines, such as:  Influenza vaccine. This is recommended every year.  Tetanus, diphtheria, and acellular pertussis (Tdap, Td)  vaccine. You may need a Td booster every 10 years.  Varicella vaccine. You may need this if you have not been vaccinated.  HPV vaccine. If you are 66 or younger, you may need three doses over 6 months.  Measles, mumps, and rubella (MMR) vaccine. You may need at least one dose of MMR.You may also need a second dose.  Pneumococcal 13-valent conjugate (PCV13) vaccine. You may need this if you have certain conditions and have not been vaccinated.  Pneumococcal polysaccharide (PPSV23) vaccine. You may need one or two doses if you smoke cigarettes or if you have certain conditions.  Meningococcal vaccine. One dose is recommended if you are age 59-21 years and a first-year college student living in a residence hall, or if you have one of several medical conditions. You may also need additional booster doses.  Hepatitis A vaccine. You may need this if you have certain conditions or if you travel or work in places where you may be exposed to hepatitis A.  Hepatitis B vaccine. You may need this if you have certain conditions or if you travel or work in places where you may be exposed to hepatitis B.  Haemophilus influenzae type b (Hib) vaccine. You may need this if you have certain risk factors.  Talk to your health care provider about which screenings and vaccines you need and how often you need them. This information is not intended to replace advice given to you by your health care provider. Make sure you discuss any questions you have with your health care provider. Document Released: 05/09/2001 Document Revised: 12/01/2015 Document Reviewed: 01/12/2015 Elsevier Interactive Patient Education  2017 [employer] Inc[employer]   

## 2016-11-22 LAB — DRUG ABUSE PANEL 10-50, U
AMPHETAMINES (1000 NG/ML SCRN): NEGATIVE
BARBITURATES: NEGATIVE
BENZODIAZEPINES: NEGATIVE
COCAINE METABOLITES: NEGATIVE
MARIJUANA MET (50 ng/mL SCRN): NEGATIVE
METHADONE: NEGATIVE
METHAQUALONE: NEGATIVE
OPIATES: NEGATIVE
PHENCYCLIDINE: NEGATIVE
PROPOXYPHENE: NEGATIVE

## 2016-11-29 LAB — PAIN MGMT, AMPHETAMINES W/MM, U
AMPHETAMINE: 1306 ng/mL — AB (ref <250)
Methamphetamine: NEGATIVE ng/mL (ref <250)

## 2016-12-15 ENCOUNTER — Encounter: Payer: Self-pay | Admitting: Internal Medicine

## 2017-02-05 ENCOUNTER — Other Ambulatory Visit: Payer: Self-pay

## 2017-02-07 NOTE — Telephone Encounter (Signed)
Refill request for Medication: Adderall XR 25mg  Last Filled: 11/20/16 x 3 Rx's, #30 each Previous / Upcoming Appt: previous CPX 11/20/16  Please advise Dr Regis Bill, thanks.

## 2017-02-07 NOTE — Telephone Encounter (Signed)
Ok to refill x 3 

## 2017-02-08 ENCOUNTER — Telehealth: Payer: Self-pay | Admitting: Internal Medicine

## 2017-02-08 MED ORDER — AMPHETAMINE-DEXTROAMPHET ER 25 MG PO CP24
25.0000 mg | ORAL_CAPSULE | ORAL | 0 refills | Status: DC
Start: 2017-02-08 — End: 2017-05-21

## 2017-02-08 MED ORDER — AMPHETAMINE-DEXTROAMPHET ER 25 MG PO CP24: 25.0000 mg | ORAL_CAPSULE | ORAL | 0 refills | Status: DC

## 2017-02-08 NOTE — Telephone Encounter (Signed)
Copied from [place] [id number]. Topic: Quick Communication - See Telephone Encounter >> Feb 08, 2017  1:17 PM Hewitt Shorts wrote: Pt is needing to check on status of the adderall rx refill the sys states that It has been approved but not printed as of yet   Best number [phone]  pt is completely out

## 2017-02-08 NOTE — Telephone Encounter (Signed)
Copied from [place] [id number]. Topic: Quick Communication - See Telephone Encounter >> Feb 08, 2017  1:17 PM Hewitt Shorts wrote: Pt is needing to check on status of the adderall rx refill the sys states that It has been approved but not printed as of yet   Best number [phone]  pt is completely out

## 2017-02-08 NOTE — Telephone Encounter (Signed)
Patient was notified previously that rx's are ready for pick up. See refill encounter.

## 2017-02-08 NOTE — Telephone Encounter (Signed)
Adderall refill 

## 2017-02-08 NOTE — Telephone Encounter (Signed)
Pt follow up on refill request and states as of today he was completely out

## 2017-02-08 NOTE — Telephone Encounter (Signed)
Pt aware that Rx's are ready to pick up.  Nothing further needed.

## 2017-02-09 NOTE — Telephone Encounter (Signed)
Varney Daily, CMA  Note   02/08/17  12:09 PM  Pt aware that Rx's are ready to pick up.  Nothing further needed.       Dorrene German, RN   Note    02/08/17 2:46 PM  Patient was notified previously that rx's are ready for pick up. See refill encounter.

## 2017-04-25 ENCOUNTER — Encounter: Payer: Self-pay | Admitting: Internal Medicine

## 2017-04-26 DIAGNOSIS — L0100 Impetigo, unspecified: Secondary | ICD-10-CM | POA: Diagnosis not present

## 2017-04-26 DIAGNOSIS — Z20818 Contact with and (suspected) exposure to other bacterial communicable diseases: Secondary | ICD-10-CM | POA: Diagnosis not present

## 2017-05-18 NOTE — Progress Notes (Signed)
Chief Complaint  Patient presents with  . Medication Management    Adderall. Good tolerance    HPI: Jonathan [name] 37 y.o. come in for  Med managments    Adhd:   Takes every day 6 30 .  Still quite helpful   Before 4-5  Pm gets  hungry or light headed   ? If needs to eat different or now    Exercises .  Runs .  Sleep : 7+  Neg tob  etoh one   Rd water tea  Soda .   Past year and last 6-8 months .  ROS: See pertinent positives and negatives per HPI. njo cp sob cough syncope  bleeding  Past Medical History:  Diagnosis Date  . ADD (attention deficit disorder)   . Allergy   . Asthma    hx consult Dr Annamaria Boots.  . Blood in stool 11/13/2014   painless  ? not mixed in  and gi opinoin  r no chnag ein bowel habits    . IBS (irritable bowel syndrome)     Family History  Problem Relation Age of Onset  . Hyperlipidemia Father   . Colon cancer Neg Hx   . Esophageal cancer Neg Hx   . Rectal cancer Neg Hx   . Stomach cancer Neg Hx     Social History   Socioeconomic History  . Marital status: Single    Spouse name: None  . Number of children: None  . Years of education: None  . Highest education level: None  Social Needs  . Financial resource strain: None  . Food insecurity - worry: None  . Food insecurity - inability: None  . Transportation needs - medical: None  . Transportation needs - non-medical: None  Occupational History  . None  Tobacco Use  . Smoking status: Never Smoker  . Smokeless tobacco: Current User    Types: Chew  Substance and Sexual Activity  . Alcohol use: Yes    Alcohol/week: 0.0 oz    Comment: socially  . Drug use: No  . Sexual activity: None  Other Topics Concern  . None  Social History Narrative   Occupation: 40 hours per week ( prev [employer])  Now works  for [employer]    Single   HH of 1  No ets.    No pets   Regular exercise- yes runner 2 x per week  Lift 2 x per week    No ets.     Outpatient Medications Prior to  Visit  Medication Sig Dispense Refill  . azelastine (OPTIVAR) 0.05 % ophthalmic solution Place 1 drop into both eyes 2 (two) times daily. 6 mL 3  . amphetamine-dextroamphetamine (ADDERALL XR) 25 MG 24 hr capsule Take 1 capsule every morning by mouth. 30 capsule 0  . amphetamine-dextroamphetamine (ADDERALL XR) 25 MG 24 hr capsule Take 1 capsule every morning by mouth. 30 capsule 0  . amphetamine-dextroamphetamine (ADDERALL XR) 25 MG 24 hr capsule Take 1 capsule every morning by mouth. 30 capsule 0   No facility-administered medications prior to visit.      EXAM:  BP 136/88 (BP Location: Right Arm, Patient Position: Sitting, Cuff Size: Normal)   Pulse 88   Temp 97.8 F (36.6 C) (Oral)   Wt 171 lb 6.4 oz (77.7 kg)   BMI 24.95 kg/m   Body mass index is 24.95 kg/m.  GENERAL: vitals reviewed and listed above, alert, oriented, appears well hydrated and in  no acute distress HEENT: atraumatic, conjunctiva  clear, no obvious abnormalities on inspection of external nose and ears OP : no lesion edema or exudate  NECK: no obvious masses on inspection palpation  LUNGS: clear to auscultation bilaterally, no wheezes, rales or rhonchi, good air movement CV: HRRR, no clubbing cyanosis or  peripheral edema nl cap refill  Abdomen:  Sof,t normal bowel sounds without hepatosplenomegaly, no guarding rebound or masses no CVA tenderness MS: moves all extremities without noticeable focal  abnormality PSYCH: pleasant and cooperative, no obvious depression or anxiety Lab Results  Component Value Date   WBC 4.8 10/29/2015   HGB 14.5 10/29/2015   HCT 42.3 10/29/2015   PLT 245.0 10/29/2015   GLUCOSE 78 10/29/2015   CHOL 195 10/29/2015   TRIG 117.0 10/29/2015   HDL 58.80 10/29/2015   LDLCALC 113 (H) 10/29/2015   ALT 20 10/29/2015   AST 21 10/29/2015   NA 140 10/29/2015   K 3.8 10/29/2015   CL 103 10/29/2015   CREATININE 0.97 10/29/2015   BUN 15 10/29/2015   CO2 30 10/29/2015   TSH 2.18 10/29/2015     BP Readings from Last 3 Encounters:  05/21/17 136/88  11/20/16 122/78  05/09/16 126/90    ASSESSMENT AND PLAN:  Discussed the following assessment and plan:  Attention deficit disorder, unspecified hyperactivity presence - Plan: CBC with Differential/Platelet, CMP, Lipid panel, TSH, T4, free, Hemoglobin A1c  Medication management - Plan: CBC with Differential/Platelet, CMP, Lipid panel, TSH, T4, free, Hemoglobin A1c  Intermittent lightheadedness - Plan: CBC with Differential/Platelet, CMP, Lipid panel, TSH, T4, free, Hemoglobin A1c Sounds like could be  coming off adder all  Plus poss dietary issues  R/o metabolic anemia  Doesn't sound cv  Will follow  To get fasting labs  -Patient advised to return or notify health care team  if  new concerns arise.  Patient Instructions   It is possible   That the problem is withdrawal sx of the adderall.    Protein snack in the afternoon before  Could get sx .   Check caffeine .   Association .   Exam is good.  Make  Lab appt for fasting appt .   If lab ok and feeling better then 6 mos cpx  But  Can come back   After 2-3 month or calendaring and making life style changes to see if better.  Or if worse.         Standley Brooking. Krystalynn Ridgeway M.D.

## 2017-05-21 ENCOUNTER — Encounter: Payer: Self-pay | Admitting: Internal Medicine

## 2017-05-21 ENCOUNTER — Ambulatory Visit: Payer: BLUE CROSS/BLUE SHIELD | Admitting: Internal Medicine

## 2017-05-21 VITALS — BP 136/88 | HR 88 | Temp 97.8°F | Wt 171.4 lb

## 2017-05-21 DIAGNOSIS — F988 Other specified behavioral and emotional disorders with onset usually occurring in childhood and adolescence: Secondary | ICD-10-CM

## 2017-05-21 DIAGNOSIS — R42 Dizziness and giddiness: Secondary | ICD-10-CM

## 2017-05-21 DIAGNOSIS — Z79899 Other long term (current) drug therapy: Secondary | ICD-10-CM | POA: Diagnosis not present

## 2017-05-21 MED ORDER — AMPHETAMINE-DEXTROAMPHET ER 25 MG PO CP24: 25.0000 mg | ORAL_CAPSULE | ORAL | 0 refills | Status: DC

## 2017-05-21 NOTE — Patient Instructions (Signed)
  It is possible   That the problem is withdrawal sx of the adderall.    Protein snack in the afternoon before  Could get sx .   Check caffeine .   Association .   Exam is good.  Make  Lab appt for fasting appt .   If lab ok and feeling better then 6 mos cpx  But  Can come back   After 2-3 month or calendaring and making life style changes to see if better.  Or if worse.

## 2017-05-28 ENCOUNTER — Other Ambulatory Visit (INDEPENDENT_AMBULATORY_CARE_PROVIDER_SITE_OTHER): Payer: BLUE CROSS/BLUE SHIELD

## 2017-05-28 DIAGNOSIS — Z79899 Other long term (current) drug therapy: Secondary | ICD-10-CM | POA: Diagnosis not present

## 2017-05-28 DIAGNOSIS — R42 Dizziness and giddiness: Secondary | ICD-10-CM | POA: Diagnosis not present

## 2017-05-28 DIAGNOSIS — F988 Other specified behavioral and emotional disorders with onset usually occurring in childhood and adolescence: Secondary | ICD-10-CM | POA: Diagnosis not present

## 2017-05-28 LAB — COMPREHENSIVE METABOLIC PANEL
ALBUMIN: 4.1 g/dL (ref 3.5–5.2)
ALK PHOS: 83 U/L (ref 39–117)
ALT: 32 U/L (ref 0–53)
AST: 23 U/L (ref 0–37)
BILIRUBIN TOTAL: 0.6 mg/dL (ref 0.2–1.2)
BUN: 13 mg/dL (ref 6–23)
CO2: 31 mEq/L (ref 19–32)
Calcium: 9.8 mg/dL (ref 8.4–10.5)
Chloride: 101 mEq/L (ref 96–112)
Creatinine, Ser: 0.86 mg/dL (ref 0.40–1.50)
GFR: 105.93 mL/min (ref >60.00)
GLUCOSE: 86 mg/dL (ref 70–99)
POTASSIUM: 4.2 meq/L (ref 3.5–5.1)
Sodium: 139 mEq/L (ref 135–145)
TOTAL PROTEIN: 7.3 g/dL (ref 6.0–8.3)

## 2017-05-28 LAB — CBC WITH DIFFERENTIAL/PLATELET
BASOS ABS: 0 10*3/uL (ref 0.0–0.1)
BASOS PCT: 0.6 % (ref 0.0–3.0)
EOS ABS: 0.1 10*3/uL (ref 0.0–0.7)
Eosinophils Relative: 2.2 % (ref 0.0–5.0)
HCT: 44 % (ref 39.0–52.0)
HEMOGLOBIN: 15.4 g/dL (ref 13.0–17.0)
LYMPHS PCT: 37.2 % (ref 12.0–46.0)
Lymphs Abs: 1.6 10*3/uL (ref 0.7–4.0)
MCHC: 35.1 g/dL (ref 30.0–36.0)
MCV: 93.2 fl (ref 78.0–100.0)
MONO ABS: 0.4 10*3/uL (ref 0.1–1.0)
Monocytes Relative: 8.7 % (ref 3.0–12.0)
Neutro Abs: 2.2 10*3/uL (ref 1.4–7.7)
Neutrophils Relative %: 51.3 % (ref 43.0–77.0)
Platelets: 247 10*3/uL (ref 150.0–400.0)
RBC: 4.72 Mil/uL (ref 4.22–5.81)
RDW: 12.6 % (ref 11.5–15.5)
WBC: 4.4 10*3/uL (ref 4.0–10.5)

## 2017-05-28 LAB — LIPID PANEL
CHOLESTEROL: 223 mg/dL — AB (ref 0–200)
HDL: 55.3 mg/dL (ref >39.00)
LDL Cholesterol: 135 mg/dL — ABNORMAL HIGH (ref 0–99)
NONHDL: 167.6
TRIGLYCERIDES: 161 mg/dL — AB (ref 0.0–149.0)
Total CHOL/HDL Ratio: 4
VLDL: 32.2 mg/dL (ref 0.0–40.0)

## 2017-05-28 LAB — T4, FREE: FREE T4: 0.68 ng/dL (ref 0.60–1.60)

## 2017-05-28 LAB — TSH: TSH: 2.27 u[IU]/mL (ref 0.35–4.50)

## 2017-05-28 LAB — HEMOGLOBIN A1C: Hgb A1c MFr Bld: 5.2 % (ref 4.6–6.5)

## 2017-07-02 ENCOUNTER — Other Ambulatory Visit: Payer: Self-pay | Admitting: Internal Medicine

## 2017-07-03 NOTE — Telephone Encounter (Signed)
Pt aware that 3 rx's sent on 05/21/17 so he should have 1-2 refills left Pt states that he has not contacted his pharmacy yet.  Pt to call the pharmacy and if any issues with the rx's then he is aware to call back and we will take care of it. Nothing further needed.

## 2017-08-28 ENCOUNTER — Other Ambulatory Visit: Payer: Self-pay | Admitting: Internal Medicine

## 2017-08-29 MED ORDER — AMPHETAMINE-DEXTROAMPHET ER 25 MG PO CP24
25.0000 mg | ORAL_CAPSULE | ORAL | 0 refills | Status: DC
Start: 2017-08-29 — End: 2017-09-26

## 2017-08-29 NOTE — Telephone Encounter (Signed)
Last filled 05/21/17 #30 x 3 rx's Last OV 05/21/17 Upcoming appt for CPE to be scheduled 10/2017  Please advise Tommi Rumps if able to refill ( #30 x 1 prescription ) in Dr Velora Mediate absence. Thanks.

## 2017-09-25 DIAGNOSIS — D225 Melanocytic nevi of trunk: Secondary | ICD-10-CM | POA: Diagnosis not present

## 2017-09-25 DIAGNOSIS — L821 Other seborrheic keratosis: Secondary | ICD-10-CM | POA: Diagnosis not present

## 2017-09-25 DIAGNOSIS — D1800 Hemangioma unspecified site: Secondary | ICD-10-CM | POA: Diagnosis not present

## 2017-09-25 DIAGNOSIS — Z86018 Personal history of other benign neoplasm: Secondary | ICD-10-CM | POA: Diagnosis not present

## 2017-09-26 ENCOUNTER — Other Ambulatory Visit: Payer: Self-pay | Admitting: Internal Medicine

## 2017-09-26 NOTE — Telephone Encounter (Signed)
Adderall  refill Last Refill:08/29/17 # 30 Last OV: 05/21/17 PCP: Dr. Regis Bill Pharmacy: [facility] [place]

## 2017-09-26 NOTE — Telephone Encounter (Signed)
Copied from [place] [id number]. Topic: Quick Communication - Rx Refill/Question >> Sep 26, 2017  1:46 PM Synthia Innocent wrote: Medication: amphetamine-dextroamphetamine (ADDERALL XR) 25 MG 24 hr capsule   Has the patient contacted their pharmacy? Yes.   (Agent: If no, request that the patient contact the pharmacy for the refill.) (Agent: If yes, when and what did the pharmacy advise?)  Preferred Pharmacy (with phone number or street name): [facility] [place]  Agent: Please be advised that RX refills may take up to 3 business days. We ask that you follow-up with your pharmacy.  Patient is out of meds

## 2017-09-28 MED ORDER — AMPHETAMINE-DEXTROAMPHET ER 25 MG PO CP24: 25.0000 mg | ORAL_CAPSULE | ORAL | 0 refills | Status: DC

## 2017-09-28 NOTE — Telephone Encounter (Signed)
Pt called in to follow up on his refill request. Pt says that he is completely out of his medication and he is leaving to go out of town tomorrow. Pt is hoping that medication could be sent in as soon as possible.    Please let pt know when Rx has been sent to pharmacy.   CB: [phone]

## 2017-09-28 NOTE — Telephone Encounter (Signed)
Left message notifying patient.

## 2017-09-28 NOTE — Telephone Encounter (Signed)
Sent electronically todaywhen recieved

## 2017-10-09 ENCOUNTER — Encounter: Payer: Self-pay | Admitting: Internal Medicine

## 2017-10-09 NOTE — Progress Notes (Signed)
Chief Complaint  Patient presents with  . Foot Pain    right    HPI: Jonathan [name] 37 y.o. come in for   Pain right foot heel  Is a runner 24 miles a week or so   For a mont? Or so right arch stiffness tender in am and works out like PF and ice  And still ok with running  Shoe inserts .   X 2-3 weeks  Sharp pain lateral heel radiation up with stepping    Had new shoes a few months ago and did have blister there but healed and not pain ful until now .   No injury . No hx stress fracture  Tends to be stiff and dec rom   ROS: See pertinent positives and negatives per HPI.  Past Medical History:  Diagnosis Date  . ADD (attention deficit disorder)   . Allergy   . Asthma    hx consult Dr Annamaria Boots.  . Blood in stool 11/13/2014   painless  ? not mixed in  and gi opinoin  r no chnag ein bowel habits    . IBS (irritable bowel syndrome)     Family History  Problem Relation Age of Onset  . Hyperlipidemia Father   . Colon cancer Neg Hx   . Esophageal cancer Neg Hx   . Rectal cancer Neg Hx   . Stomach cancer Neg Hx     Social History   Socioeconomic History  . Marital status: Single    Spouse name: Not on file  . Number of children: Not on file  . Years of education: Not on file  . Highest education level: Not on file  Occupational History  . Not on file  Social Needs  . Financial resource strain: Not on file  . Food insecurity:    Worry: Not on file    Inability: Not on file  . Transportation needs:    Medical: Not on file    Non-medical: Not on file  Tobacco Use  . Smoking status: Never Smoker  . Smokeless tobacco: Current User    Types: Chew  Substance and Sexual Activity  . Alcohol use: Yes    Alcohol/week: 0.0 oz    Comment: socially  . Drug use: No  . Sexual activity: Not on file  Lifestyle  . Physical activity:    Days per week: Not on file    Minutes per session: Not on file  . Stress: Not on file  Relationships  . Social connections:    Talks on phone:  Not on file    Gets together: Not on file    Attends religious service: Not on file    Active member of club or organization: Not on file    Attends meetings of clubs or organizations: Not on file    Relationship status: Not on file  Other Topics Concern  . 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    No pets   Regular exercise- yes runner 2 x per week  Lift 2 x per week    No ets.     Outpatient Medications Prior to Visit  Medication Sig Dispense Refill  . amphetamine-dextroamphetamine (ADDERALL XR) 25 MG 24 hr capsule Take 1 capsule by mouth every morning. 30 capsule 0  .  azelastine (OPTIVAR) 0.05 % ophthalmic solution Place 1 drop into both eyes 2 (two) times daily. 6 mL 3  . amphetamine-dextroamphetamine (ADDERALL XR) 25 MG 24 hr capsule Take 1 capsule by mouth every morning. 30 capsule 0  . amphetamine-dextroamphetamine (ADDERALL XR) 25 MG 24 hr capsule Take 1 capsule by mouth every morning. 30 capsule 0   No facility-administered medications prior to visit.      EXAM:  BP 132/85   Pulse 85   Temp 98.1 F (36.7 C)   Wt 168 lb (76.2 kg)   BMI 24.45 kg/m   Body mass index is 24.45 kg/m.  GENERAL: vitals reviewed and listed above, alert, oriented, appears well hydrated and in no acute distress HEENT: atraumatic, conjunctiva  clear, no obvious abnormalities on inspection of external nose and ears MS: moves all extremities without noticeable focal  Abnormality r foot   Min tenderness heel  Area of lateral heel calcaneuso but no achilles  Tenderness  Some stiffness of area .  No swelling redness  Or crepitus gait nl .  PSYCH: pleasant and cooperative, no obvious depression or anxiety  BP Readings from Last 3 Encounters:  10/10/17 132/85  05/21/17 136/88  11/20/16 122/78    ASSESSMENT AND PLAN:  Discussed the following assessment and  plan:  Pain of right heel - Plan: DG [place] Right, DG Foot Complete Right  Right foot pain - Plan: DG [place] Right, DG Foot Complete Right  PF r foot mild and  Disc reg measures  To control  .  Ice stretch etc  Right lateral heel pain  Check x ray   Today  Topical antiinflammatory  May need to cross train   heelp support shoes   If  persistent or progressive  Advise sports medicine consults  Help  -Patient advised to return or notify health care team  if  new concerns arise.  Patient Instructions  Get x ray foot and heel .   topcial antiinflammatory as planned  And ice .  Check shoes .  This sounds like plantar fasciitis .   consider heel support.    If  persistent or progressive can see .  Sports medicine as discussed .     Plantar Fasciitis Plantar fasciitis is a painful foot condition that affects the heel. It occurs when the band of tissue that connects the toes to the heel bone (plantar fascia) becomes irritated. This can happen after exercising too much or doing other repetitive activities (overuse injury). The pain from plantar fasciitis can range from mild irritation to severe pain that makes it difficult for you to walk or move. The pain is usually worse in the morning or after you have been sitting or lying down for a while. What are the causes? This condition may be caused by:  Standing for long periods of time.  Wearing shoes that do not fit.  Doing high-impact activities, including running, aerobics, and ballet.  Being overweight.  Having an abnormal way of walking (gait).  Having tight calf muscles.  Having high arches in your feet.  Starting a new athletic activity.  What are the signs or symptoms? The main symptom of this condition is heel pain. Other symptoms include:  Pain that gets worse after activity or exercise.  Pain that is worse in the morning or after resting.  Pain that goes away after you walk for a few minutes.  How is this  diagnosed? This condition may be diagnosed based on your  signs and symptoms. Your health care provider will also do a physical exam to check for:  A tender area on the bottom of your foot.  A high arch in your foot.  Pain when you move your foot.  Difficulty moving your foot.  You may also need to have imaging studies to confirm the diagnosis. These can include:  X-rays.  Ultrasound.  MRI.  How is this treated? Treatment for plantar fasciitis depends on the severity of the condition. Your treatment may include:  Rest, ice, and over-the-counter pain medicines to manage your pain.  Exercises to stretch your calves and your plantar fascia.  A splint that holds your foot in a stretched, upward position while you sleep (night splint).  Physical therapy to relieve symptoms and prevent problems in the future.  Cortisone injections to relieve severe pain.  Extracorporeal shock wave therapy (ESWT) to stimulate damaged plantar fascia with electrical impulses. It is often used as a last resort before surgery.  Surgery, if other treatments have not worked after 12 months.  Follow these instructions at home:  Take medicines only as directed by your health care provider.  Avoid activities that cause pain.  Roll the bottom of your foot over a bag of ice or a bottle of cold water. Do this for 20 minutes, 3-4 times a day.  Perform simple stretches as directed by your health care provider.  Try wearing athletic shoes with air-sole or gel-sole cushions or soft shoe inserts.  Wear a night splint while sleeping, if directed by your health care provider.  Keep all follow-up appointments with your health care provider. How is this prevented?  Do not perform exercises or activities that cause heel pain.  Consider finding low-impact activities if you continue to have problems.  Lose weight if you need to. The best way to prevent plantar fasciitis is to avoid the activities that  aggravate your plantar fascia. Contact a health care provider if:  Your symptoms do not go away after treatment with home care measures.  Your pain gets worse.  Your pain affects your ability to move or do your daily activities. This information is not intended to replace advice given to you by your health care provider. Make sure you discuss any questions you have with your health care provider. Document Released: 12/06/2000 Document Revised: 08/16/2015 Document Reviewed: 01/21/2014 Elsevier Interactive Patient Education  2018 [place]. Panosh M.D.

## 2017-10-10 ENCOUNTER — Encounter: Payer: Self-pay | Admitting: Internal Medicine

## 2017-10-10 ENCOUNTER — Ambulatory Visit (INDEPENDENT_AMBULATORY_CARE_PROVIDER_SITE_OTHER): Payer: BLUE CROSS/BLUE SHIELD

## 2017-10-10 ENCOUNTER — Ambulatory Visit: Payer: BLUE CROSS/BLUE SHIELD | Admitting: Internal Medicine

## 2017-10-10 VITALS — BP 132/85 | HR 85 | Temp 98.1°F | Wt 168.0 lb

## 2017-10-10 DIAGNOSIS — M79671 Pain in right foot: Secondary | ICD-10-CM | POA: Diagnosis not present

## 2017-10-10 DIAGNOSIS — M25571 Pain in right ankle and joints of right foot: Secondary | ICD-10-CM | POA: Diagnosis not present

## 2017-10-10 MED ORDER — DICLOFENAC SODIUM 1 % TD GEL: 2.0000 g | Freq: Four times a day (QID) | TRANSDERMAL | 1 refills | Status: DC

## 2017-10-10 NOTE — Patient Instructions (Signed)
Get x ray foot and heel .   topcial antiinflammatory as planned  And ice .  Check shoes .  This sounds like plantar fasciitis .   consider heel support.    If  persistent or progressive can see .  Sports medicine as discussed .     Plantar Fasciitis Plantar fasciitis is a painful foot condition that affects the heel. It occurs when the band of tissue that connects the toes to the heel bone (plantar fascia) becomes irritated. This can happen after exercising too much or doing other repetitive activities (overuse injury). The pain from plantar fasciitis can range from mild irritation to severe pain that makes it difficult for you to walk or move. The pain is usually worse in the morning or after you have been sitting or lying down for a while. What are the causes? This condition may be caused by:  Standing for long periods of time.  Wearing shoes that do not fit.  Doing high-impact activities, including running, aerobics, and ballet.  Being overweight.  Having an abnormal way of walking (gait).  Having tight calf muscles.  Having high arches in your feet.  Starting a new athletic activity.  What are the signs or symptoms? The main symptom of this condition is heel pain. Other symptoms include:  Pain that gets worse after activity or exercise.  Pain that is worse in the morning or after resting.  Pain that goes away after you walk for a few minutes.  How is this diagnosed? This condition may be diagnosed based on your signs and symptoms. Your health care provider will also do a physical exam to check for:  A tender area on the bottom of your foot.  A high arch in your foot.  Pain when you move your foot.  Difficulty moving your foot.  You may also need to have imaging studies to confirm the diagnosis. These can include:  X-rays.  Ultrasound.  MRI.  How is this treated? Treatment for plantar fasciitis depends on the severity of the condition. Your treatment  may include:  Rest, ice, and over-the-counter pain medicines to manage your pain.  Exercises to stretch your calves and your plantar fascia.  A splint that holds your foot in a stretched, upward position while you sleep (night splint).  Physical therapy to relieve symptoms and prevent problems in the future.  Cortisone injections to relieve severe pain.  Extracorporeal shock wave therapy (ESWT) to stimulate damaged plantar fascia with electrical impulses. It is often used as a last resort before surgery.  Surgery, if other treatments have not worked after 12 months.  Follow these instructions at home:  Take medicines only as directed by your health care provider.  Avoid activities that cause pain.  Roll the bottom of your foot over a bag of ice or a bottle of cold water. Do this for 20 minutes, 3-4 times a day.  Perform simple stretches as directed by your health care provider.  Try wearing athletic shoes with air-sole or gel-sole cushions or soft shoe inserts.  Wear a night splint while sleeping, if directed by your health care provider.  Keep all follow-up appointments with your health care provider. How is this prevented?  Do not perform exercises or activities that cause heel pain.  Consider finding low-impact activities if you continue to have problems.  Lose weight if you need to. The best way to prevent plantar fasciitis is to avoid the activities that aggravate your plantar fascia. Contact a  health care provider if:  Your symptoms do not go away after treatment with home care measures.  Your pain gets worse.  Your pain affects your ability to move or do your daily activities. This information is not intended to replace advice given to you by your health care provider. Make sure you discuss any questions you have with your health care provider. Document Released: 12/06/2000 Document Revised: 08/16/2015 Document Reviewed: 01/21/2014 Elsevier Interactive Patient  Education  [employer].

## 2017-10-12 ENCOUNTER — Telehealth: Payer: Self-pay | Admitting: *Deleted

## 2017-10-12 NOTE — Telephone Encounter (Signed)
Prior auth for Diclofenac sodium gel 1% sent to [email or web address]-key [id number].

## 2017-10-19 NOTE — Telephone Encounter (Signed)
Fax received from [facility] stating the request was denied and this was given to Dr Velora Mediate asst.

## 2017-10-19 NOTE — Telephone Encounter (Signed)
Denial letter placed in red folder with alternatives listed.

## 2017-10-23 ENCOUNTER — Other Ambulatory Visit: Payer: Self-pay | Admitting: Internal Medicine

## 2017-10-25 ENCOUNTER — Telehealth: Payer: Self-pay | Admitting: Family Medicine

## 2017-10-25 MED ORDER — AMPHETAMINE-DEXTROAMPHET ER 25 MG PO CP24: 25.0000 mg | ORAL_CAPSULE | ORAL | 0 refills | Status: DC

## 2017-10-25 NOTE — Telephone Encounter (Signed)
Sent in electronically .  Please tell him also the insurance will not  Ok the topical Voltaren  the will only give for osteoarthritis  And after oral medication

## 2017-10-25 NOTE — Telephone Encounter (Signed)
Copied from [place] [id number]. Topic: General - Other >> Oct 25, 2017 12:01 PM Synthia Innocent wrote: Reason for CRM: checking status of refill, amphetamine-dextroamphetamine (ADDERALL XR) 25 MG 24 hr capsule. See Mychart message.

## 2017-10-25 NOTE — Telephone Encounter (Signed)
Last filled 05/21/17, #25  Please advise Dr Regis Bill, thanks.

## 2017-10-26 NOTE — Telephone Encounter (Signed)
Called pharmacy to check on the status of Rx. Pharmacist stated that it is ready for pick-up. Pt notified via VM. No further action needed!

## 2017-11-21 ENCOUNTER — Other Ambulatory Visit: Payer: Self-pay | Admitting: Internal Medicine

## 2017-11-21 NOTE — Telephone Encounter (Signed)
Copied from [place]. Topic: Quick Communication - Rx Refill/Question >> Nov 21, 2017  9:30 AM Synthia Innocent wrote: Medication: amphetamine-dextroamphetamine (ADDERALL XR) 25 MG 24 hr capsule, requesting by Friday  Has the patient contacted their pharmacy? Yes.   (Agent: If no, request that the patient contact the pharmacy for the refill.) (Agent: If yes, when and what did the pharmacy advise?)  Preferred Pharmacy (with phone number or street name):[facility] [address]  Agent: Please be advised that RX refills may take up to 3 business days. We ask that you follow-up with your pharmacy.

## 2017-11-21 NOTE — Telephone Encounter (Signed)
Refill of adderall  LOV 10/10/17 Dr. Regis Bill  LRF 10/25/17  #30 0 refills  [facility] [address]

## 2017-11-22 NOTE — Telephone Encounter (Signed)
Please advise Dr Panosh, thanks.   

## 2017-11-23 MED ORDER — AMPHETAMINE-DEXTROAMPHET ER 25 MG PO CP24: 25.0000 mg | ORAL_CAPSULE | ORAL | 0 refills | Status: DC

## 2017-11-23 NOTE — Telephone Encounter (Signed)
Left detailed message making aware that Rx refilled  Nothing further needed.

## 2017-11-23 NOTE — Telephone Encounter (Signed)
Was out of office   Yesterday afternoon  Just got this request .  Now over 75  days old   Sent in electronically .

## 2017-11-23 NOTE — Telephone Encounter (Signed)
Pt called to check status of refill.  

## 2017-11-27 NOTE — Telephone Encounter (Signed)
No topicals offered  So no comparable  Med available

## 2017-12-17 ENCOUNTER — Other Ambulatory Visit: Payer: Self-pay

## 2017-12-19 ENCOUNTER — Other Ambulatory Visit: Payer: Self-pay

## 2017-12-19 MED ORDER — AMPHETAMINE-DEXTROAMPHET ER 25 MG PO CP24: 25.0000 mg | ORAL_CAPSULE | ORAL | 0 refills | Status: DC

## 2017-12-19 NOTE — Telephone Encounter (Signed)
Last filled 11/23/17 #30 x 0rf Pt is due for annual visit  Last OV 04/2017 and pt was advised to return in 6 months for annual visit.   Please advise Dr Regis Bill, thanks.

## 2017-12-19 NOTE — Telephone Encounter (Signed)
Sent in electronically . Agree  Needs to make appt

## 2017-12-26 DIAGNOSIS — Z23 Encounter for immunization: Secondary | ICD-10-CM | POA: Diagnosis not present

## 2017-12-26 DIAGNOSIS — D225 Melanocytic nevi of trunk: Secondary | ICD-10-CM | POA: Diagnosis not present

## 2018-01-16 ENCOUNTER — Other Ambulatory Visit: Payer: Self-pay

## 2018-01-16 NOTE — Telephone Encounter (Signed)
Last refill #30 12/19/17 Last seen 10/10/17 for acute visit, 05/21/17 for med check  Please advise Dr Regis Bill, thanks.

## 2018-01-22 MED ORDER — AMPHETAMINE-DEXTROAMPHET ER 25 MG PO CP24: 25.0000 mg | ORAL_CAPSULE | ORAL | 0 refills | Status: DC

## 2018-01-22 NOTE — Telephone Encounter (Signed)
This has been handled via mychart in Rx encounter.  Nothing further needed.

## 2018-01-22 NOTE — Telephone Encounter (Signed)
Patient calling to check status of refill request. It has been 6 days. Please advise. Thanks!

## 2018-01-22 NOTE — Telephone Encounter (Signed)
Sent in electronically .   Have hm make a cpx or ov  In January.

## 2018-01-22 NOTE — Telephone Encounter (Signed)
Unable to reach via telephone - incorrect numbers listed on chart mychart message sent to make aware that Rx refilled.  Pt needs OV in Jan 2020 for CPE -- pt aware.  Nothing further needed.

## 2018-02-12 NOTE — Progress Notes (Signed)
Chief Complaint  Patient presents with  . Annual Exam    No new concerns    HPI: Patient  Jonathan [name]  37 y.o. comes in today for Preventive Health Care visit  And med check .  ADHD:   Med still helping a lot at his job     Engineering geologist day  Helps with :   Concentration organization   Mental motivation. . No sig se.  No change health No asthma  cv sx   Health Maintenance  Topic Date Due  . HIV Screening  08/31/1994  . TETANUS/TDAP  07/18/2020  . INFLUENZA VACCINE  Completed   Health Maintenance Review LIFESTYLE:  Exercise:  Running   12 and 18 miles per week .  Running   ocass Achilles.      Tobacco/ETS:  no Alcohol:   2+ week  Sugar beverages:  One per day diet soda  Sleep:   6-8  Drug use: no   HH of 3 dog Work:  40 +   ROS:  Achilles sore at times GEN/ HEENT: No fever, significant weight changes sweats headaches vision problems hearing changes, CV/ PULM; No chest pain shortness of breath cough, syncope,edema  change in exercise tolerance. GI /GU: No adominal pain, vomiting, change in bowel habits. No blood in the stool. No significant GU symptoms. SKIN/HEME: ,no acute skin rashes suspicious lesions or bleeding. No lymphadenopathy, nodules, masses.  NEURO/ PSYCH:  No neurologic signs such as weakness numbness. No depression anxiety. IMM/ Allergy: No unusual infections.  Allergy .   REST of 12 system review negative except as per HPI   Past Medical History:  Diagnosis Date  . ADD (attention deficit disorder)   . Allergy   . Asthma    hx consult Dr Annamaria Boots.  . Blood in stool 11/13/2014   painless  ? not mixed in  and gi opinoin  r no chnag ein bowel habits    . IBS (irritable bowel syndrome)     Past Surgical History:  Procedure Laterality Date  . UPPER GASTROINTESTINAL ENDOSCOPY     eval by GI neg  . WRIST SURGERY     right- March 2016    Family History  Problem Relation Age of Onset  . Hyperlipidemia Father   . Colon cancer Neg Hx   . Esophageal  cancer Neg Hx   . Rectal cancer Neg Hx   . Stomach cancer Neg Hx     Social History   Socioeconomic History  . Marital status: Single    Spouse name: Not on file  . Number of children: Not on file  . Years of education: Not on file  . Highest education level: Not on file  Occupational History  . Not on file  Social Needs  . Financial resource strain: Not on file  . Food insecurity:    Worry: Not on file    Inability: Not on file  . Transportation needs:    Medical: Not on file    Non-medical: Not on file  Tobacco Use  . Smoking status: Never Smoker  . Smokeless tobacco: Current User    Types: Chew  Substance and Sexual Activity  . Alcohol use: Yes    Alcohol/week: 0.0 standard drinks    Comment: socially  . Drug use: No  . Sexual activity: Not on file  Lifestyle  . Physical activity:    Days per week: Not on file    Minutes per session: Not on file  .  Stress: Not on file  Relationships  . Social connections:    Talks on phone: Not on file    Gets together: Not on file    Attends religious service: Not on file    Active member of club or organization: Not on file    Attends meetings of clubs or organizations: Not on file    Relationship status: Not on file  Other Topics Concern  . 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    No pets   Regular exercise- yes runner 2 x per week  Lift 2 x per week    No ets.     Outpatient Medications Prior to Visit  Medication Sig Dispense Refill  . azelastine (OPTIVAR) 0.05 % ophthalmic solution Place 1 drop into both eyes 2 (two) times daily. 6 mL 3  . amphetamine-dextroamphetamine (ADDERALL XR) 25 MG 24 hr capsule Take 1 capsule by mouth every morning. 30 capsule 0  . diclofenac sodium (VOLTAREN) 1 % GEL Apply 2 g topically 4 (four) times daily. (Patient not taking: Reported on 02/14/2018) 2 Tube 1    No facility-administered medications prior to visit.      EXAM:  BP 132/78 (BP Location: Right Arm, Patient Position: Sitting, Cuff Size: Normal)   Pulse 92   Temp 98.4 F (36.9 C) (Oral)   Ht 5' 8.75" (1.746 m)   Wt 169 lb 6.4 oz (76.8 kg)   BMI 25.20 kg/m   Body mass index is 25.2 kg/m. Wt Readings from Last 3 Encounters:  02/14/18 169 lb 6.4 oz (76.8 kg)  10/10/17 168 lb (76.2 kg)  05/21/17 171 lb 6.4 oz (77.7 kg)    Physical Exam: Vital signs reviewed OEV:OJJK is a well-developed well-nourished alert cooperative    who appearsr stated age in no acute distress.  HEENT: normocephalic atraumatic , Eyes: PERRL EOM's full, conjunctiva clear, Nares: paten,t no deformity discharge or tenderness., Ears: no deformity EAC's clear TMs with normal landmarks. Mouth: clear OP, no lesions, edema.  Moist mucous membranes. Dentition in adequate repair. NECK: supple without masses, thyromegaly or bruits. CHEST/PULM:  Clear to auscultation and percussion breath sounds equal no wheeze , rales or rhonchi. No chest wall deformities or tenderness. . CV: PMI is nondisplaced, S1 S2 no gallops, murmurs, rubs. Peripheral pulses are full without delay.No JVD .  ABDOMEN: Bowel sounds normal nontender  No guard or rebound, no hepato splenomegal no CVA tenderness.  No hernia. Extremtities:  No clubbing cyanosis or edema, no acute joint swelling or redness no focal atrophy NEURO:  Oriented x3, cranial nerves 3-12 appear to be intact, no obvious focal weakness,gait within normal limits no abnormal reflexes or asymmetrical SKIN: No acute rashes normal turgor, color, no bruising or petechiae. PSYCH: Oriented, good eye contact, no obvious depression anxiety, cognition and judgment appear normal. LN: no cervical axillary inguinal adenopathy  Lab Results  Component Value Date   WBC 4.4 05/28/2017   HGB 15.4 05/28/2017   HCT 44.0 05/28/2017   PLT 247.0 05/28/2017   GLUCOSE 86 05/28/2017   CHOL 223 (H)  05/28/2017   TRIG 161.0 (H) 05/28/2017   HDL 55.30 05/28/2017   LDLCALC 135 (H) 05/28/2017   ALT 32 05/28/2017   AST 23 05/28/2017   NA 139 05/28/2017   K 4.2 05/28/2017  CL 101 05/28/2017   CREATININE 0.86 05/28/2017   BUN 13 05/28/2017   CO2 31 05/28/2017   TSH 2.27 05/28/2017   HGBA1C 5.2 05/28/2017    BP Readings from Last 3 Encounters:  02/14/18 132/78  10/10/17 132/85  05/21/17 136/88    Lab results reviewed with patient    ASSESSMENT AND PLAN:  Discussed the following assessment and plan:  Visit for preventive health examination  Medication management  Attention deficit disorder, unspecified hyperactivity presence - cont med   Hyperlipidemia, unspecified hyperlipidemia type - family hx  os such   healthy  life style  .  check  yearly  lipids Patient Care Team: Panosh, Standley Brooking, MD as PCP - General Crista Luria, MD as Attending Physician (Dermatology) Patient Instructions  Continue lifestyle intervention healthy eating and exercise . Check lipid panel next year ROV in 6 months med check   Ask about   3 separate electronic rx and at next refill can do this    Preventive Care 18-39 Years, Male Preventive care refers to lifestyle choices and visits with your health care provider that can promote health and wellness. What does preventive care include?  A yearly physical exam. This is also called an annual well check.  Dental exams once or twice a year.  Routine eye exams. Ask your health care provider how often you should have your eyes checked.  Personal lifestyle choices, including: ? Daily care of your teeth and gums. ? Regular physical activity. ? Eating a healthy diet. ? Avoiding tobacco and drug use. ? Limiting alcohol use. ? Practicing safe sex. What happens during an annual well check? The services and screenings done by your health care provider during your annual well check will depend on your age, overall health, lifestyle risk factors,  and family history of disease. Counseling Your health care provider may ask you questions about your:  Alcohol use.  Tobacco use.  Drug use.  Emotional well-being.  Home and relationship well-being.  Sexual activity.  Eating habits.  Work and work Statistician.  Screening You may have the following tests or measurements:  Height, weight, and BMI.  Blood pressure.  Lipid and cholesterol levels. These may be checked every 5 years starting at age 47.  Diabetes screening. This is done by checking your blood sugar (glucose) after you have not eaten for a while (fasting).  Skin check.  Hepatitis C blood test.  Hepatitis B blood test.  Sexually transmitted disease (STD) testing.  Discuss your test results, treatment options, and if necessary, the need for more tests with your health care provider. Vaccines Your health care provider may recommend certain vaccines, such as:  Influenza vaccine. This is recommended every year.  Tetanus, diphtheria, and acellular pertussis (Tdap, Td) vaccine. You may need a Td booster every 10 years.  Varicella vaccine. You may need this if you have not been vaccinated.  HPV vaccine. If you are 72 or younger, you may need three doses over 6 months.  Measles, mumps, and rubella (MMR) vaccine. You may need at least one dose of MMR.You may also need a second dose.  Pneumococcal 13-valent conjugate (PCV13) vaccine. You may need this if you have certain conditions and have not been vaccinated.  Pneumococcal polysaccharide (PPSV23) vaccine. You may need one or two doses if you smoke cigarettes or if you have certain conditions.  Meningococcal vaccine. One dose is recommended if you are age 52-21 years and a Market researcher living in a residence hall,  or if you have one of several medical conditions. You may also need additional booster doses.  Hepatitis A vaccine. You may need this if you have certain conditions or if you travel or  work in places where you may be exposed to hepatitis A.  Hepatitis B vaccine. You may need this if you have certain conditions or if you travel or work in places where you may be exposed to hepatitis B.  Haemophilus influenzae type b (Hib) vaccine. You may need this if you have certain risk factors.  Talk to your health care provider about which screenings and vaccines you need and how often you need them. This information is not intended to replace advice given to you by your health care provider. Make sure you discuss any questions you have with your health care provider. Document Released: 05/09/2001 Document Revised: 12/01/2015 Document Reviewed: 01/12/2015 Elsevier Interactive Patient Education  2018 [place]. Panosh M.D.

## 2018-02-14 ENCOUNTER — Encounter: Payer: Self-pay | Admitting: Internal Medicine

## 2018-02-14 ENCOUNTER — Ambulatory Visit (INDEPENDENT_AMBULATORY_CARE_PROVIDER_SITE_OTHER): Payer: BLUE CROSS/BLUE SHIELD | Admitting: Internal Medicine

## 2018-02-14 VITALS — BP 132/78 | HR 92 | Temp 98.4°F | Ht 68.75 in | Wt 169.4 lb

## 2018-02-14 DIAGNOSIS — E785 Hyperlipidemia, unspecified: Secondary | ICD-10-CM

## 2018-02-14 DIAGNOSIS — Z0000 Encounter for general adult medical examination without abnormal findings: Secondary | ICD-10-CM

## 2018-02-14 DIAGNOSIS — F988 Other specified behavioral and emotional disorders with onset usually occurring in childhood and adolescence: Secondary | ICD-10-CM

## 2018-02-14 DIAGNOSIS — Z79899 Other long term (current) drug therapy: Secondary | ICD-10-CM | POA: Diagnosis not present

## 2018-02-14 MED ORDER — AMPHETAMINE-DEXTROAMPHET ER 25 MG PO CP24: 25.0000 mg | ORAL_CAPSULE | ORAL | 0 refills | Status: DC

## 2018-02-14 NOTE — Patient Instructions (Addendum)
Continue lifestyle intervention healthy eating and exercise . Check lipid panel next year ROV in 6 months med check   Ask about   3 separate electronic rx and at next refill can do this    Preventive Care 18-39 Years, Male Preventive care refers to lifestyle choices and visits with your health care provider that can promote health and wellness. What does preventive care include?  A yearly physical exam. This is also called an annual well check.  Dental exams once or twice a year.  Routine eye exams. Ask your health care provider how often you should have your eyes checked.  Personal lifestyle choices, including: ? Daily care of your teeth and gums. ? Regular physical activity. ? Eating a healthy diet. ? Avoiding tobacco and drug use. ? Limiting alcohol use. ? Practicing safe sex. What happens during an annual well check? The services and screenings done by your health care provider during your annual well check will depend on your age, overall health, lifestyle risk factors, and family history of disease. Counseling Your health care provider may ask you questions about your:  Alcohol use.  Tobacco use.  Drug use.  Emotional well-being.  Home and relationship well-being.  Sexual activity.  Eating habits.  Work and work Statistician.  Screening You may have the following tests or measurements:  Height, weight, and BMI.  Blood pressure.  Lipid and cholesterol levels. These may be checked every 5 years starting at age 65.  Diabetes screening. This is done by checking your blood sugar (glucose) after you have not eaten for a while (fasting).  Skin check.  Hepatitis C blood test.  Hepatitis B blood test.  Sexually transmitted disease (STD) testing.  Discuss your test results, treatment options, and if necessary, the need for more tests with your health care provider. Vaccines Your health care provider may recommend certain vaccines, such as:  Influenza  vaccine. This is recommended every year.  Tetanus, diphtheria, and acellular pertussis (Tdap, Td) vaccine. You may need a Td booster every 10 years.  Varicella vaccine. You may need this if you have not been vaccinated.  HPV vaccine. If you are 14 or younger, you may need three doses over 6 months.  Measles, mumps, and rubella (MMR) vaccine. You may need at least one dose of MMR.You may also need a second dose.  Pneumococcal 13-valent conjugate (PCV13) vaccine. You may need this if you have certain conditions and have not been vaccinated.  Pneumococcal polysaccharide (PPSV23) vaccine. You may need one or two doses if you smoke cigarettes or if you have certain conditions.  Meningococcal vaccine. One dose is recommended if you are age 27-21 years and a first-year college student living in a residence hall, or if you have one of several medical conditions. You may also need additional booster doses.  Hepatitis A vaccine. You may need this if you have certain conditions or if you travel or work in places where you may be exposed to hepatitis A.  Hepatitis B vaccine. You may need this if you have certain conditions or if you travel or work in places where you may be exposed to hepatitis B.  Haemophilus influenzae type b (Hib) vaccine. You may need this if you have certain risk factors.  Talk to your health care provider about which screenings and vaccines you need and how often you need them. This information is not intended to replace advice given to you by your health care provider. Make sure you discuss any questions  you have with your health care provider. Document Released: 05/09/2001 Document Revised: 12/01/2015 Document Reviewed: 01/12/2015 Elsevier Interactive Patient Education  [employer].

## 2018-03-13 ENCOUNTER — Other Ambulatory Visit: Payer: Self-pay | Admitting: Internal Medicine

## 2018-03-13 NOTE — Telephone Encounter (Signed)
Copied from [place] [id number]. Topic: Quick Communication - Rx Refill/Question >> Mar 13, 2018  1:28 PM Keene Breath wrote: Medication: amphetamine-dextroamphetamine (ADDERALL XR) 25 MG 24 hr capsule, Patient would like 3 46-month supply  Patient called to request a refill for the above medication  Preferred Pharmacy (with phone number or street name): [id number] [place], [place] - [address] [phone] (Phone) [phone] (Fax)

## 2018-03-13 NOTE — Telephone Encounter (Signed)
Requested medication (s) are due for refill today -yes  Requested medication (s) are on the active medication list -yes  Future visit scheduled -yes  Last refill: 02/14/18  Notes to clinic: Patient is requesting a non-delegated Rx. Patient would like to get 3 Rx at a time if possible.  Requested Prescriptions  Pending Prescriptions Disp Refills   amphetamine-dextroamphetamine (ADDERALL XR) 25 MG 24 hr capsule 30 capsule 0    Sig: Take 1 capsule by mouth every morning.     Not Delegated - Psychiatry:  Stimulants/ADHD Failed - 03/13/2018  1:34 PM      Failed - This refill cannot be delegated      Failed - Urine Drug Screen completed in last 360 days.      Passed - Valid encounter within last 3 months    Recent Outpatient Visits          3 weeks ago Visit for preventive health examination   [place] [facility] [place], [place]   5 months ago Pain of right heel   Therapist, music at [place], MD   9 months ago Attention deficit disorder, unspecified hyperactivity presence   Therapist, music at [facility], Standley Brooking, MD   1 year ago Visit for preventive health examination   [place] [facility], Standley Brooking, MD   1 year ago Attention deficit disorder, unspecified hyperactivity presence   Therapist, music at [facility], Standley Brooking, MD      Future Appointments            In 5 months Panosh, Standley Brooking, MD [place] [facility] [place], [facility]            Requested Prescriptions  Pending Prescriptions Disp Refills   amphetamine-dextroamphetamine (ADDERALL XR) 25 MG 24 hr capsule 30 capsule 0    Sig: Take 1 capsule by mouth every morning.     Not Delegated - Psychiatry:  Stimulants/ADHD Failed - 03/13/2018  1:34 PM      Failed - This refill cannot be delegated      Failed - Urine Drug Screen completed in last 360 days.      Passed - Valid encounter within last 3 months    Recent Outpatient Visits          3  weeks ago Visit for preventive health examination   [place] [facility] [place], MD   5 months ago Pain of right heel   Therapist, music at [place], MD   9 months ago Attention deficit disorder, unspecified hyperactivity presence   Therapist, music at [facility], Standley Brooking, MD   1 year ago Visit for preventive health examination   [place] at [facility], Standley Brooking, MD   1 year ago Attention deficit disorder, unspecified hyperactivity presence   Therapist, music at [facility], Standley Brooking, MD      Future Appointments            In 5 months Panosh, Standley Brooking, MD [place] at [place], [facility]

## 2018-03-14 NOTE — Telephone Encounter (Signed)
Please advise Dr Sarajane Jews is able to refill in Dr Velora Mediate absence. Thanks.

## 2018-03-15 MED ORDER — AMPHETAMINE-DEXTROAMPHET ER 25 MG PO CP24: 25.0000 mg | ORAL_CAPSULE | ORAL | 0 refills | Status: DC

## 2018-03-15 NOTE — Telephone Encounter (Signed)
Sent in electronically .   I sent in the 3 30# rx for the 90 day supply as we discussed at his last visit

## 2018-04-04 DIAGNOSIS — M79671 Pain in right foot: Secondary | ICD-10-CM

## 2018-04-04 NOTE — Telephone Encounter (Signed)
Please make a referral for him to sports medicine

## 2018-04-09 ENCOUNTER — Ambulatory Visit: Payer: BLUE CROSS/BLUE SHIELD | Admitting: Sports Medicine

## 2018-04-09 ENCOUNTER — Encounter: Payer: Self-pay | Admitting: Sports Medicine

## 2018-04-09 ENCOUNTER — Ambulatory Visit: Payer: Self-pay

## 2018-04-09 VITALS — BP 154/88 | HR 84 | Ht 68.75 in | Wt 169.2 lb

## 2018-04-09 DIAGNOSIS — M79672 Pain in left foot: Secondary | ICD-10-CM

## 2018-04-09 DIAGNOSIS — M7672 Peroneal tendinitis, left leg: Secondary | ICD-10-CM

## 2018-04-09 MED ORDER — IBUPROFEN-FAMOTIDINE 800-26.6 MG PO TABS: 1.0000 | ORAL_TABLET | Freq: Three times a day (TID) | ORAL | 2 refills | Status: DC | PRN

## 2018-04-09 MED ORDER — IBUPROFEN-FAMOTIDINE 800-26.6 MG PO TABS: 1.0000 | ORAL_TABLET | Freq: Three times a day (TID) | ORAL | 0 refills | Status: AC | PRN

## 2018-04-09 NOTE — Procedures (Signed)
LIMITED MSK ULTRASOUND OF Left foot Images were obtained and interpreted by myself, Teresa Coombs, DO  Images have been saved and stored to PACS system. Images obtained on: GE S7 Ultrasound machine  FINDINGS:   Normal-appearing plantar fascia and longitudinal arch  Peroneal tendons are thickened and there is a component of intra-tenderness fluid directly at the level of the lateral malleolus.  There is moderate degree of pain with sonopalpation.  Left Achilles has small amount of retrocalcaneal inflammation with hypoechoic change within the retrocalcaneal bursa.  Normal-appearing Achilles tendon.  IMPRESSION:  1. Peroneal tendinitis 2. Normal plantar fascia 3. Likely small amount of retrocalcaneal bursitis.

## 2018-04-09 NOTE — Patient Instructions (Signed)
Instructions for [place], [place] and [place]:  Your prescription will be filled through a participating [facility] mail order pharmacy.  You will receive a phone call or text from one of the participating pharmacies which can be located in any state in the [place].  You must communicate directly with them to have this medication filled.  When the pharmacy contacts you, they will need your mailing address (for shipment of the medication) andy they will need payment information if you have a copay (typically no more than $10). If you have not heard from them 2-3 days after your appointment with Dr. Paulla Fore, contact [facility] directly at [phone].

## 2018-04-09 NOTE — Progress Notes (Signed)
Jonathan [name]. Jonathan [name], [place] at [address] [place]  [name] - 38 y.o. male MRN [id number]  Date of birth: 1980/04/05  Visit Date: April 10, 2018  PCP: Burnis Medin, MD   Referred by: Burnis Medin, MD  SUBJECTIVE:  Chief Complaint  Patient presents with  . Initial Assessment    Referred by Dr. Regis Bill.   . L heel/achilles pain    HPI: Patient presents with several months of worsening left heel pain that is progressively worsened up to the point that he stopped running the day after Christmas.  He is a fairly steady runner running approximately 15 miles per week.  He has had issues with his bilateral feet over the past year with plantar fasciitis on the right and Achilles tendinitis on the right that has improved.  Denies any significant changes in his training although he is training for an upcoming half marathon.  He has moderate amount of pain with first step in the morning that tends to loosen up over the day.  He has been using a compression sleeve with some improvement in his pain.  Only occasional ibuprofen does provide some improvement minimal.  Worse with to that he.  REVIEW OF SYSTEMS: Denies night time disturbances.  Denies fevers, chills, or night sweats. Denies unexplained weight loss. Denies personal history of cancer. Denies changes in bowel or bladder habits. Denies recent unreported falls. Denies new or worsening dyspnea or wheezing. Denies headaches or dizziness.  He does have some associated numbness and tingling otherwise right lower extremity he denies numbness, tingling or weakness In the extremities.  Denies dizziness or presyncopal episodes Denies lower extremity edema   HISTORY:  Prior history reviewed and updated per electronic medical record.  Social History   Occupational History  . Not on file  Tobacco Use  . Smoking status: Never Smoker  . Smokeless tobacco: Current User    Types:  Chew  Substance and Sexual Activity  . Alcohol use: Yes    Alcohol/week: 0.0 standard drinks    Comment: socially  . Drug use: No  . 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    No pets   Regular exercise- yes runner 2 x per week  Lift 2 x per week    No ets.     Past Medical History:  Diagnosis Date  . ADD (attention deficit disorder)   . Allergy   . Asthma    hx consult Dr Annamaria Boots.  . Blood in stool 11/13/2014   painless  ? not mixed in  and gi opinoin  r no chnag ein bowel habits    . IBS (irritable bowel syndrome)      Past Surgical History:  Procedure Laterality Date  . UPPER GASTROINTESTINAL ENDOSCOPY     eval by GI neg  . WRIST SURGERY     right- March 2016    family history includes Hyperlipidemia in his father. There is no history of Colon cancer, Esophageal cancer, Rectal cancer, or Stomach cancer.  DATA OBTAINED & REVIEWED:  Recent Labs    05/28/17 0815  HGBA1C 5.2  CALCIUM 9.8  AST 23  ALT 32  TSH 2.27   Problem  Peroneal Tendinitis of  Left Lower Extremity   No specialty comments available.  OBJECTIVE:  VS:  HT:5' 8.75" (174.6 cm)   WT:169 lb 3.2 oz (76.7 kg)  BMI:25.18    BP:(!) 154/88  HR:84bpm  TEMP: ( )  RESP:98 %   PHYSICAL EXAM: Well-developed, Well-nourished and In no acute distress Pupils are equal., EOM intact without nystagmus. and No scleral icterus. Alert & appropriately interactive. and Not depressed or anxious appearing. Warm and well perfused Left Foot and heel Exam:   Well aligned, no significant deformity he does have a moderately high cavus foot  No overlying skin changes.   TTP over Lateral calcaneus, posterior aspect of the lateral malleolus.  Insertion of her the base of the fifth metatarsal. Non tender over Plantar fascial origin.  Achilles avascular zone   Moderate degree of pain with passive dorsiflexion.  He pain with resisted plantarflexion and eversion.  This is mild.     ASSESSMENT  1. Pain of left heel   2. Peroneal tendinitis of left lower extremity     PROCEDURES:  None  PLAN:  Pertinent additional documentation may be included in corresponding procedure notes, imaging studies, problem based documentation and patient instructions.  No problem-specific Assessment & Plan notes found for this encounter.  Peroneal tendinitis with likely some component of Tina synovitis.  Continue with compression, prescription NSAID, 2 weeks of continued nonrunning and we will plan to follow-up with him at that time to advance his therapeutic exercises and do gait evaluation.  He may need cushioned insoles and we can discuss this at that time.  Okay to perform other aerobic activity nonrunning including pool jogging, elliptical, exercise bike.  He did have an upcoming half marathon that he was planning to do in March.  We discussed this will likely not be something that he is not going to be prepared to do   Activity modifications and the importance of avoiding exacerbating activities (limiting pain to no more than a 4 / 10 during or following activity) recommended and discussed. Discussed red flag symptoms that warrant earlier emergent evaluation and patient voices understanding. Meds ordered this encounter  Medications  . Ibuprofen-Famotidine (DUEXIS) 800-26.6 MG TABS    Sig: Take 1 tablet by mouth 3 (three) times daily as needed. 1 tab po tid X 14 days then 1 tab po tid as needed    Dispense:  90 tablet    Refill:  2    Home Phone      [phone] Work Phone      [phone] Mobile          [phone]   . Ibuprofen-Famotidine (DUEXIS) 800-26.6 MG TABS    Sig: Take 1 tablet by mouth 3 (three) times daily as needed for up to 1 day.    Dispense:  9 tablet    Refill:  0     Imaging Orders     [place] MSK POCT ULTRASOUND  At follow up will  plan: to prescribed home therapeutic exercises.   Return in about 2 weeks (around 04/23/2018).          Gerda Diss, [place] [place] [facility] Physician

## 2018-04-10 ENCOUNTER — Encounter: Payer: Self-pay | Admitting: Sports Medicine

## 2018-04-10 DIAGNOSIS — M7672 Peroneal tendinitis, left leg: Secondary | ICD-10-CM | POA: Insufficient documentation

## 2018-04-23 ENCOUNTER — Ambulatory Visit: Payer: BLUE CROSS/BLUE SHIELD | Admitting: Sports Medicine

## 2018-04-23 ENCOUNTER — Encounter: Payer: Self-pay | Admitting: Sports Medicine

## 2018-04-23 ENCOUNTER — Ambulatory Visit (INDEPENDENT_AMBULATORY_CARE_PROVIDER_SITE_OTHER): Payer: BLUE CROSS/BLUE SHIELD

## 2018-04-23 VITALS — BP 140/68 | HR 102 | Ht 68.75 in | Wt 171.6 lb

## 2018-04-23 DIAGNOSIS — M79672 Pain in left foot: Secondary | ICD-10-CM

## 2018-04-23 NOTE — Patient Instructions (Addendum)
We are ordering an MRI for you today.  The imaging office will be calling you to schedule your appointment after we obtain authorization from your insurance company.   Please be sure you have signed up for MyChart so that we can get your results to you.  We will be in touch with you as soon as we can.  Please know, it can take up to 3-4 business days for the radiologist and Dr. Paulla Fore to have time to review the results and determine the best appropriate action.  If there is something that appears to be surgical or needs a referral to other specialists we will let you know through [place] or telephone.  Otherwise we will plan to schedule a follow up appointment with Dr. Paulla Fore once we have the results.   [facility]: [phone]

## 2018-04-23 NOTE — Progress Notes (Signed)
Jonathan [name]. [name], [name]  LEVAUGHN [name] - 38 y.o. male MRN [id number]  Date of birth: [birth year]  Visit Date: April 23, 2018  PCP: Burnis Medin, MD   Referred by: Burnis Medin, MD  SUBJECTIVE:  Chief Complaint  Patient presents with  . Follow-up    L heel and L peroneal tendon.  Duexis. Compression brace.    HPI: Patient is here for 2-week follow-up after an initial presentation.  He reports being 40 to 50% better.  He has stopped running and has been doing the elliptical with no significant pain with these activities.  He still describes sharp pain over the posterior aspect of the ankle into the heel.  He is having minimal pain with first up in the morning.  This was previously extremely painful.  REVIEW OF SYSTEMS: He denies any numbness or tingling.  No acute injury.    HISTORY:  Prior history reviewed and updated per electronic medical record.  Social History   Occupational History  . Not on file  Tobacco Use  . Smoking status: Never Smoker  . Smokeless tobacco: Current User    Types: Chew  Substance and Sexual Activity  . Alcohol use: Yes    Alcohol/week: 0.0 standard drinks    Comment: socially  . Drug use: No  . 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    No pets   Regular exercise- yes runner 2 x per week  Lift 2 x per week    No ets.     OBJECTIVE:  VS:  HT:5' 8.75" (174.6 cm)   WT:171 lb 9.6 oz (77.8 kg)  BMI:25.53    BP:140/68  HR:(!) 102 bpm  TEMP: ( )  RESP:98 %   PHYSICAL EXAM: Adult male.  No acute distress.  Alert and appropriate. His left heel is normal-appearing.  He does have a slight equinus contracture.  He has focal pain over the lateral aspect of the calcaneus as well as over the peroneal  tendons but is most focally tender over the retrocalcaneal region.  No pain with tenial squeeze but he is focally tender directly over the bone and has marked pain with tuning fork test directly over the lateral aspect superior calcaneus.  He has no focal tenderness with palpation of the plantar fascia.  Minimal pain with palpation Achilles insertion.  No palpable defect within the Achilles.   ASSESSMENT   1. Pain of left heel       PROCEDURES:  None  PLAN:  Pertinent additional documentation may be included in corresponding procedure notes, imaging studies, problem based documentation and patient instructions.  Ultimately he has had persistent significant pain with positive tuning fork test and focal tenderness over the lateral aspect of the calcaneus.  I am concerned for a potential stress fracture of the calcaneus and further diagnostic evaluation with plain film x-rays and MRI.  Continue with Duexis.  Continue with avoiding running but okay to continue with nonweightbearing activities including elliptical and exercise bike.  Continue with cool water soaking.  We will need to consider custom cushioned orthotics at follow-up based on the findings but this will be discussed after he returns for his MRI review.  Will need to discuss formal HEP once further information obtained. Cool water soaking recommended per AVS.   RICE (Rest, ICE, Compression, Elevation) principles reviewed with the patient.   Activity modifications and the importance of avoiding exacerbating activities (limiting pain to no more than a 4 / 10 during or following activity) recommended and discussed.   Discussed red flag symptoms that warrant earlier emergent evaluation and patient voices understanding.    No orders of the defined types were placed in this encounter. Lab Orders  No laboratory test(s) ordered today   Imaging Orders  DG Os Calcis Left  MR ANKLE LEFT WO CONTRAST  Referral Orders  No  referral(s) requested today      Return for L ankle MRI results review in office.          Gerda Diss, [place] [facility] Physician

## 2018-04-29 ENCOUNTER — Ambulatory Visit
Admission: RE | Admit: 2018-04-29 | Discharge: 2018-04-29 | Disposition: A | Discharge status: Home / Self Care | Payer: BLUE CROSS/BLUE SHIELD | Source: Ambulatory Visit | Attending: Sports Medicine | Admitting: Sports Medicine

## 2018-04-29 DIAGNOSIS — M79672 Pain in left foot: Secondary | ICD-10-CM | POA: Diagnosis not present

## 2018-05-02 ENCOUNTER — Encounter: Payer: Self-pay | Admitting: Sports Medicine

## 2018-05-02 ENCOUNTER — Ambulatory Visit: Payer: BLUE CROSS/BLUE SHIELD | Admitting: Sports Medicine

## 2018-05-02 VITALS — BP 138/86 | HR 85 | Ht 68.75 in | Wt 172.6 lb

## 2018-05-02 DIAGNOSIS — M792 Neuralgia and neuritis, unspecified: Secondary | ICD-10-CM

## 2018-05-02 DIAGNOSIS — M7672 Peroneal tendinitis, left leg: Secondary | ICD-10-CM | POA: Diagnosis not present

## 2018-05-02 MED ORDER — GABAPENTIN 300 MG PO CAPS: ORAL_CAPSULE | ORAL | 1 refills | Status: DC

## 2018-05-02 NOTE — Progress Notes (Signed)
Jonathan [name]. Jonathan [name], [place] at [place]  Jonathan [name] - 38 y.o. male MRN [id number]  Date of birth: 1980/09/12  Visit Date: May 04, 2018  PCP: Burnis Medin, MD   Referred by: Burnis Medin, MD  SUBJECTIVE:  Chief Complaint  Patient presents with  . Follow-up    L heel pain.  MRI (04/29/18) results review today.  Duexis.  Ankle compression.    HPI: Patient is here for left ankle follow-up.  He reports essentially no change in his symptoms.  He is using the CPAP compression sock as well as using Duexis with some calming of his symptoms but with any type of terminal knee extension and active dorsiflexion his pain becomes severe localized to the plantar aspect of the foot.  This has awaken him at night several times he does have numbness across the left heel  REVIEW OF SYSTEMS: Per HPI  HISTORY:  Prior history reviewed and updated per electronic medical record.  Patient Active Problem List   Diagnosis Date Noted  . Peroneal tendinitis of left lower extremity 04/10/2018    L calcaneus XR - 04/23/18  L ankle MRI - 04/29/18   . Allergic conjunctivitis and rhinitis 07/08/2014    seasonal itching  throoughout upper  add INCS contoller  and  eye drops for now  consider short dourse pred and singulair if needed .expectant  management,    . Visit for preventive health examination 06/05/2014  . Right wrist pain 12/05/2013    newonset tennis active more than passive  painful hold nad shake hands  refer ortho sports med nsaid rel rest for now    . Medication management 11/18/2012  . Dysplastic nevus 01/02/2011    bx per Dr Tonia Brooms   . Atypical moles 12/21/2010  . Routine general medical examination at a health care facility 07/19/2010  . ASTHMA 09/22/2007    Qualifier: Diagnosis of  By: Annamaria Boots MD, Clinton D    . ALLERGIC RHINITIS 09/16/2007    Qualifier: Diagnosis of  By: Julien Girt CMA, Marliss Czar     . Attention deficit  disorder 11/27/2006    Qualifier: Diagnosis of  By: Scherrie Gerlach     . IRRITABLE BOWEL SYNDROME 11/27/2006    Qualifier: Diagnosis of  By: Sherren Mocha RN, Dorian Pod      Social History   Occupational History  . Not on file  Tobacco Use  . Smoking status: Never Smoker  . Smokeless tobacco: Current User    Types: Chew  Substance and Sexual Activity  . Alcohol use: Yes    Alcohol/week: 0.0 standard drinks    Comment: socially  . Drug use: No  . 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    No pets   Regular exercise- yes runner 2 x per week  Lift 2 x per week    No ets.     OBJECTIVE:  VS:  HT:5' 8.75" (174.6 cm)   WT:172 lb 9.6 oz (78.3 kg)  BMI:25.68    BP:138/86  HR:85bpm  TEMP: ( )  RESP:97 %   PHYSICAL EXAM: Adult male. No acute distress.  Alert and appropriate. Left foot and ankle are normal-appearing there is no significant skin discoloration.  He has a generalized hyperesthesia over the entire aspect of the plantar aspect and posterior aspect of the heel.  He has pain with calf squeeze.  In a knee flexed position he has no pain with passive dorsiflexion or plantarflexion.  And a knee extended position with ankle dorsiflexion he has sharp shooting pain into the plantar aspect of the foot.  Negative Tinel's around the ankle.  MRI reviewed.  The questionable metal artifact is likely artifact given he has no reproducible significant focal tenderness with scraping of the plantar aspect of the foot.  There is no palpable defect there either.     ASSESSMENT:  1. Neuralgia and neuritis   2. Peroneal tendinitis of left lower extremity     PROCEDURES:  None  PLAN:  Pertinent additional documentation may be included in corresponding procedure notes, imaging studies, problem based documentation and patient  instructions.  No problem-specific Assessment & Plan notes found for this encounter.   His MRI is reassuring is not having significant tearing or significant plantar fasciitis.  I suspect this is actually coming more from a Baxter's neuralgia type presentation given the symptoms with the knee in an extended position this is likely impinging somewhere in the lower leg.  He does have markedly tight Hamstrings and Will Benefit from Dry Needling and Referral for Physical Therapy Was Placed.  Activity modifications and the importance of avoiding exacerbating activities (limiting pain to no more than a 4 / 10 during or following activity) recommended and discussed.  Discussed red flag symptoms that warrant earlier emergent evaluation and patient voices understanding.  As he continues to improve he will be able to slowly increase his physical activity as tolerated.    >50% of this 25 minute visit spent in direct patient counseling and/or coordination of care.  Discussion was focused on education regarding the in discussing the pathoetiology and anticipated clinical course of the above condition.  Meds ordered this encounter  Medications  . gabapentin (NEURONTIN) 300 MG capsule    Sig: Start with 1 tab po qhs X 1 week, then increase to 1 tab po bid X 1 week then 1 tab po tid prn    Dispense:  90 capsule    Refill:  1   Lab Orders  No laboratory test(s) ordered today   Imaging Orders  No imaging studies ordered today    Referral Orders     Ambulatory referral to Physical Therapy  Return in about 4 weeks (around 05/30/2018). I would like to see him run at follow-up appointment in 4 weeks.          Gerda Diss, [place] [facility] Physician

## 2018-05-04 ENCOUNTER — Encounter: Payer: Self-pay | Admitting: Sports Medicine

## 2018-05-06 ENCOUNTER — Other Ambulatory Visit: Payer: Self-pay

## 2018-05-06 ENCOUNTER — Ambulatory Visit: Payer: BLUE CROSS/BLUE SHIELD | Attending: Sports Medicine | Admitting: Physical Therapy

## 2018-05-06 DIAGNOSIS — M79662 Pain in left lower leg: Secondary | ICD-10-CM

## 2018-05-06 DIAGNOSIS — M25675 Stiffness of left foot, not elsewhere classified: Secondary | ICD-10-CM

## 2018-05-06 DIAGNOSIS — M62831 Muscle spasm of calf: Secondary | ICD-10-CM | POA: Diagnosis not present

## 2018-05-06 NOTE — Therapy (Signed)
[facility] [address] [place], [place], [address] Phone: [phone]   Fax:  [phone]  Physical Therapy Evaluation  Patient Details  Name: Jonathan [name] MRN: [id number] Date of Birth: 11-11-1980 Referring Provider (PT): Gerda Diss   Encounter Date: 05/06/2018  PT End of Session - 05/06/18 1605    Visit Number  1    Date for PT Re-Evaluation  07/01/18    PT Start Time  7371    PT Stop Time  1526    PT Time Calculation (min)  41 min    Activity Tolerance  Patient tolerated treatment well;Patient limited by pain    Behavior During Therapy  [facility] for tasks assessed/performed       Past Medical History:  Diagnosis Date  . ADD (attention deficit disorder)   . Allergy   . Asthma    hx consult Dr Annamaria Boots.  . Blood in stool 11/13/2014   painless  ? not mixed in  and gi opinoin  r no chnag ein bowel habits    . IBS (irritable bowel syndrome)     Past Surgical History:  Procedure Laterality Date  . UPPER GASTROINTESTINAL ENDOSCOPY     eval by GI neg  . WRIST SURGERY     right- March 2016    There were no vitals filed for this visit.   Subjective Assessment - 05/06/18 1450    Subjective  I started running faster around when it happened.  It was hurting to walk and now that is not the case.      Limitations  --   running   Patient Stated Goals  return to running    Currently in Pain?  Yes    Pain Score  8     Pain Location  Leg    Pain Orientation  Lower;Lateral    Pain Descriptors / Indicators  Sharp    Pain Type  Acute pain    Aggravating Factors   wakes at night sometimes, stretching the muscles    Pain Relieving Factors  gabapentin helped it feel better but still the same now, getting off of it and not moving    Effect of Pain on Daily Activities  unable to run         [facility] PT Assessment - 05/07/18 0001      Assessment   Medical Diagnosis  M76.72 (ICD-10-CM) - Peroneal tendinitis of left  lower extremity    Referring Provider (PT)  Teresa Coombs D    Onset Date/Surgical Date  03/21/18   just woke up like that   Prior Therapy  No      Precautions   Precautions  None      Restrictions   Weight Bearing Restrictions  No      Balance Screen   Has the patient fallen in the past 6 months  No      [address] residence    Living Arrangements  Spouse/significant other;Children      Prior Function   Level of Independence  Independent    Vocation  Full time employment      Cognition   Overall Cognitive Status  Within Functional Limits for tasks assessed      Observation/Other Assessments   Focus on Therapeutic Outcomes (FOTO)   43% limited   24% limited goal     Posture/Postural Control   Posture Comments  overpronation Lt>Rt  AROM   Overall AROM Comments  Lt ankle DF LE straight -5; Rt 5 deg; DF knee bent Rt and Lt 15 deg      Strength   Overall Strength Comments  5/5 throughout      Flexibility   Soft Tissue Assessment /Muscle Length  yes    Hamstrings  75%      Palpation   Palpation comment  lateral gastroc, soleus Lt LE large and tender trigger points throughout; tight and tender peroneals and calcaneal attachments on the left      Ambulation/Gait   Gait Comments  increased pronation on left and mid foot strike                Objective measurements completed on examination: See above findings.     [facility] Adult PT Treatment/Exercise - 05/06/18 0001      Self-Care   Self-Care  Other Self-Care Comments    Other Self-Care Comments   education on foam roller and inital HEP  Access Code: [email or web address]       Manual Therapy   Manual Therapy  Soft tissue mobilization    Manual therapy comments  peroneals and gastroc, soleus on left           Trigger Point Dry Needling - 05/06/18 1603    Consent Given?  Yes    Education Handout Provided  Yes    Muscles Treated Lower Body  Gastrocnemius;Soleus    peroneals all left side   Gastrocnemius Response  Twitch response elicited;Palpable increased muscle length    Soleus Response  Twitch response elicited;Palpable increased muscle length           PT Education - 05/06/18 1605    Education Details   Access Code: [email or web address]     Person(s) Educated  Patient    Methods  Explanation;Demonstration;Handout;Verbal cues    Comprehension  Verbalized understanding;Returned demonstration       PT Short Term Goals - 05/06/18 1742      PT SHORT TERM GOAL #1   Title  pt will report 25% less pain    Time  4    Period  Weeks    Status  New    Target Date  06/03/18      PT SHORT TERM GOAL #2   Title  Pt will be ind with initial HEP    Time  4    Period  Weeks    Status  New    Target Date  06/03/18        PT Long Term Goals - 05/06/18 1728      PT LONG TERM GOAL #1   Title  pt will be ind with final HEP to safely work towards return to running    Time  8    Period  Weeks    Status  New    Target Date  07/01/18      PT LONG TERM GOAL #2   Title  Pt will have 5 deg of AROM dorsiflexion with knee in full extension for improved ROM with running    Time  8    Period  Weeks    Status  New    Target Date  07/01/18      PT LONG TERM GOAL #3   Title  pt will have full and pain free AROM of left ankle    Time  8    Period  Weeks    Status  New    Target Date  07/01/18      PT LONG TERM GOAL #4   Title  Pt will be able to begin running slow and short distances without increased pain    Time  8    Period  Weeks    Status  New    Target Date  07/01/18      PT LONG TERM GOAL #5   Title  FOTO < or = to 24% limited    Time  8    Period  Weeks    Status  New    Target Date  07/01/18             Plan - 05/07/18 0947    Clinical Impression Statement  Pt presents to clinic due to peroneal tendinitis around posterior lateral malleolus.  He has significantly tight muslce spasms throughout left calves and peroneal muscles.  He  has the most tenderness when performing dorsiflexion of left ankle when knee in full extension.  He has increased pronation during gait on left.  Running gait was not assessed today due to pain, but patient can hop if staying on his toes.  Heel strike is the most painful especially with increased force.  Pt will benefit from soft tissue work and pain management.  He will benefit from skilled PT to address gait abnormailies and core and hip strength in order to avoid injuries when he is able to return to running.    Clinical Presentation  Stable    Clinical Decision Making  Low    Rehab Potential  Excellent    PT Frequency  1x / week    PT Duration  8 weeks    PT Treatment/Interventions  ADLs/Self Care Home Management;Biofeedback;Cryotherapy;Electrical Stimulation;Iontophoresis 4mg /ml Dexamethasone;Moist Heat    PT Next Visit Plan  ionto if order signed, STM/dry needle/foam roll to calves and peroneal muscles. ankle ROM, arch strengthening, single leg and hip strengthening    PT Home Exercise Plan  Access Code: [email or web address]     Recommended Other Services  cert placed 6/31/49    Consulted and Agree with Plan of Care  Patient       Patient will benefit from skilled therapeutic intervention in order to improve the following deficits and impairments:  Abnormal gait, Increased muscle spasms, Decreased range of motion, Increased fascial restricitons  Visit Diagnosis: Stiffness of left foot, not elsewhere classified  Pain in left lower leg  Muscle spasm of calf     Problem List Patient Active Problem List   Diagnosis Date Noted  . Peroneal tendinitis of left lower extremity 04/10/2018  . Allergic conjunctivitis and rhinitis 07/08/2014  . Visit for preventive health examination 06/05/2014  . Right wrist pain 12/05/2013  . Medication management 11/18/2012  . Dysplastic nevus 01/02/2011  . Atypical moles 12/21/2010  . Routine general medical examination at a health care facility 07/19/2010  .  ASTHMA 09/22/2007  . ALLERGIC RHINITIS 09/16/2007  . Attention deficit disorder 11/27/2006  . IRRITABLE BOWEL SYNDROME 11/27/2006    Zannie Cove, PT 05/07/2018, 9:59 AM  [facility] [address], [place], [place], [address] Phone: [phone]   Fax:  [phone]  Name: Jonathan [name] MRN: [id number] Date of Birth: Nov 11, 1980

## 2018-05-06 NOTE — Patient Instructions (Signed)
IONTOPHORESIS PATIENT PRECAUTIONS & CONTRAINDICATIONS:  . Redness under one or both electrodes can occur.  This characterized by a uniform redness that usually disappears within 12 hours of treatment. . Small pinhead size blisters may result in response to the drug.  Contact your physician if the problem persists more than 24 hours. . On rare occasions, iontophoresis therapy can result in temporary skin reactions such as rash, inflammation, irritation or burns.  The skin reactions may be the result of individual sensitivity to the ionic solution used, the condition of the skin at the start of treatment, reaction to the materials in the electrodes, allergies or sensitivity to dexamethasone, or a poor connection between the patch and your skin.  Discontinue using iontophoresis if you have any of these reactions and report to your therapist. . Remove the Patch or electrodes if you have any undue sensation of pain or burning during the treatment and report discomfort to your therapist. . Tell your Therapist if you have had known adverse reactions to the application of electrical current. . If using the Patch, the LED light will turn off when treatment is complete and the patch can be removed.  Approximate treatment time is 1-3 hours.  Remove the patch when light goes off or after 6 hours. . The Patch can be worn during normal activity, however excessive motion where the electrodes have been placed can cause poor contact between the skin and the electrode or uneven electrical current resulting in greater risk of skin irritation. . Keep out of the reach of children.   . DO NOT use if you have a cardiac pacemaker or any other electrically sensitive implanted device. . DO NOT use if you have a known sensitivity to dexamethasone. . DO NOT use during Magnetic Resonance Imaging (MRI). . DO NOT use over broken or compromised skin (e.g. sunburn, cuts, or acne) due to the increased risk of skin reaction. . DO  NOT SHAVE over the area to be treated:  To establish good contact between the Patch and the skin, excessive hair may be clipped. . DO NOT place the Patch or electrodes on or over your eyes, directly over your heart, or brain. . DO NOT reuse the Patch or electrodes as this may cause burns to occur.  Trigger Point Dry Needling  . What is Trigger Point Dry Needling (DN)? o DN is a physical therapy technique used to treat muscle pain and dysfunction. Specifically, DN helps deactivate muscle trigger points (muscle knots).  o A thin filiform needle is used to penetrate the skin and stimulate the underlying trigger point. The goal is for a local twitch response (LTR) to occur and for the trigger point to relax. No medication of any kind is injected during the procedure.   . What Does Trigger Point Dry Needling Feel Like?  o The procedure feels different for each individual patient. Some patients report that they do not actually feel the needle enter the skin and overall the process is not painful. Very mild bleeding may occur. However, many patients feel a deep cramping in the muscle in which the needle was inserted. This is the local twitch response.   . How Will I feel after the treatment? o Soreness is normal, and the onset of soreness may not occur for a few hours. Typically this soreness does not last longer than two days.  o Bruising is uncommon, however; ice can be used to decrease any possible bruising.  o In rare cases feeling tired or   after the treatment is normal. In addition, your symptoms may get worse before they get better, this period will typically not last longer than 24 hours.   . What Can I do After My Treatment? o Increase your hydration by drinking more water for the next 24 hours. o You may place ice or heat on the areas treated that have become sore, however, do not use heat on inflamed or bruised areas. Heat often brings more relief post needling. o You can continue  your regular activities, but vigorous activity is not recommended initially after the treatment for 24 hours. o DN is best combined with other physical therapy such as strengthening, stretching, and other therapies.    [facility] [address] [place], [place] [address] Phone # [phone] Fax [phone] Access Code: [email or web address]  URL: [email or web address]  Date: 05/06/2018  Prepared by: Lovett Calender   Exercises  Seated Calf Stretch with Strap - 3 reps - 1 sets - 30 sec hold - 1x daily - 7x weekly

## 2018-05-15 ENCOUNTER — Ambulatory Visit: Payer: BLUE CROSS/BLUE SHIELD | Admitting: Physical Therapy

## 2018-05-15 ENCOUNTER — Encounter: Payer: Self-pay | Admitting: Physical Therapy

## 2018-05-15 DIAGNOSIS — M79662 Pain in left lower leg: Secondary | ICD-10-CM | POA: Diagnosis not present

## 2018-05-15 DIAGNOSIS — M62831 Muscle spasm of calf: Secondary | ICD-10-CM

## 2018-05-15 DIAGNOSIS — M25675 Stiffness of left foot, not elsewhere classified: Secondary | ICD-10-CM

## 2018-05-15 NOTE — Therapy (Signed)
[facility] [address] [place], [place], [address] Phone: [phone]   Fax:  [phone]  Physical Therapy Treatment  Patient Details  Name: Jonathan [name] MRN: [id number] Date of Birth: 05/30/1980 Referring Provider (PT): Gerda Diss   Encounter Date: 05/15/2018  PT End of Session - 05/15/18 0801    Visit Number  2    Date for PT Re-Evaluation  07/01/18    PT Start Time  0800    PT Stop Time  0850    PT Time Calculation (min)  50 min    Activity Tolerance  Patient tolerated treatment well    Behavior During Therapy  [facility] for tasks assessed/performed       Past Medical History:  Diagnosis Date  . ADD (attention deficit disorder)   . Allergy   . Asthma    hx consult Dr Annamaria Boots.  . Blood in stool 11/13/2014   painless  ? not mixed in  and gi opinoin  r no chnag ein bowel habits    . IBS (irritable bowel syndrome)     Past Surgical History:  Procedure Laterality Date  . UPPER GASTROINTESTINAL ENDOSCOPY     eval by GI neg  . WRIST SURGERY     right- March 2016    There were no vitals filed for this visit.  Subjective Assessment - 05/15/18 0803    Subjective  No pain this AM but I have not done anything yet.    Currently in Pain?  Yes    Multiple Pain Sites  No                       [facility] Adult PT Treatment/Exercise - 05/15/18 0001      Self-Care   Other Self-Care Comments   Where to weighbear throughthe foot, finiding his triangle when stannding      Iontophoresis   Type of Iontophoresis  Dexamethasone   #1 skin intact,    Location  Lt distal to lat malleolus    Dose  1 ml    Time  6 hour wear   Pt verbalizes skin precautions     Manual Therapy   Manual Therapy  Soft tissue mobilization    Soft tissue mobilization  LT lateral gastroc soft tissue work including trigger point work: Regulatory affairs officer to later [employer] and upgrading to [employer] calf , light feathering  around lat malleolus      Ankle Exercises: Stretches   Set designer  --   2x20 sec   Other Stretch  Melt Method calf release series.       Ankle Exercises: Standing   SLS  LT 2x 30 sec   VC to contract glutes, added to HEP   Other Standing Ankle Exercises  Donkey kicks for glutes bil 10x   added to HEP            PT Education - 05/15/18 1455    Education Details  HEP: Single leg stance, standing donkey kicks for Lt glute strength, ionto precautions wear &  time    Person(s) Educated  Patient    Methods  Explanation;Demonstration;Verbal cues;Handout    Comprehension  Verbalized understanding;Returned demonstration       PT Short Term Goals - 05/06/18 1742      PT SHORT TERM GOAL #1   Title  pt will report 25% less pain    Time  4    Period  Weeks    Status  New    Target Date  06/03/18      PT SHORT TERM GOAL #2   Title  Pt will be ind with initial HEP    Time  4    Period  Weeks    Status  New    Target Date  06/03/18        PT Long Term Goals - 05/06/18 1728      PT LONG TERM GOAL #1   Title  pt will be ind with final HEP to safely work towards return to running    Time  8    Period  Weeks    Status  New    Target Date  07/01/18      PT LONG TERM GOAL #2   Title  Pt will have 5 deg of AROM dorsiflexion with knee in full extension for improved ROM with running    Time  8    Period  Weeks    Status  New    Target Date  07/01/18      PT LONG TERM GOAL #3   Title  pt will have full and pain free AROM of left ankle    Time  8    Period  Weeks    Status  New    Target Date  07/01/18      PT LONG TERM GOAL #4   Title  Pt will be able to begin running slow and short distances without increased pain    Time  8    Period  Weeks    Status  New    Target Date  07/01/18      PT LONG TERM GOAL #5   Title  FOTO < or = to 24% limited    Time  8    Period  Weeks    Status  New    Target Date  07/01/18            Plan - 05/15/18 3875     Clinical Impression Statement  Pt presents this AM with no reports of pain but fairly early in the day so he has done much. Pt did have to "run" to his car to get out of the rain last night and he reports feeling only a "twinge" of pain. Soft tissue work reveals overdevelpoed lateral LT gastroc and underdeveloped medial gastroc, almost flacid feeling.  Added glute strength to HEP including single leg stance exercise. Pt visably shakey in his LTLE during single leg balance.     Rehab Potential  Excellent    PT Frequency  1x / week    PT Duration  8 weeks    PT Next Visit Plan  ionto #2, continue with LT hip strength, soft tissue work to lateral gastroc, take ankle ROM should be better.    PT Home Exercise Plan  Access Code: [email or web address]     Consulted and Agree with Plan of Care  Patient       Patient will benefit from skilled therapeutic intervention in order to improve the following deficits and impairments:  Abnormal gait, Increased muscle spasms, Decreased range of motion, Increased fascial restricitons  Visit Diagnosis: Stiffness of left foot, not elsewhere classified  Pain in left lower leg  Muscle spasm of calf     Problem List Patient Active Problem List   Diagnosis Date Noted  . Peroneal tendinitis of left lower extremity 04/10/2018  . Allergic conjunctivitis and rhinitis 07/08/2014  .  Visit for preventive health examination 06/05/2014  . Right wrist pain 12/05/2013  . Medication management 11/18/2012  . Dysplastic nevus 01/02/2011  . Atypical moles 12/21/2010  . Routine general medical examination at a health care facility 07/19/2010  . ASTHMA 09/22/2007  . ALLERGIC RHINITIS 09/16/2007  . Attention deficit disorder 11/27/2006  . IRRITABLE BOWEL SYNDROME 11/27/2006    Jonathan [name], PTA 05/15/2018, 2:58 PM  [facility] [address], [place], [place], [address] Phone: [phone]   Fax:   [phone]  Name: Jonathan [name] MRN: [id number] Date of Birth: 03-01-1981  Access Code: [email or web address]  URL: [email or web address]  Date: 05/15/2018  Prepared by: Myrene Galas   Exercises  Seated Calf Stretch with Strap - 3 reps - 1 sets - 30 sec hold - 1x daily - 7x weekly  Standing Hip Extension with Leg Bent and Support - 10 reps - 3x daily - 7x weekly  Standing Single Leg Heel Raise - 1 reps - 1 sets - 30 hold - 3x daily - 7x weekly

## 2018-05-15 NOTE — Patient Instructions (Signed)

## 2018-05-22 ENCOUNTER — Encounter: Payer: Self-pay | Admitting: Physical Therapy

## 2018-05-22 ENCOUNTER — Ambulatory Visit: Payer: BLUE CROSS/BLUE SHIELD | Admitting: Physical Therapy

## 2018-05-22 DIAGNOSIS — M62831 Muscle spasm of calf: Secondary | ICD-10-CM | POA: Diagnosis not present

## 2018-05-22 DIAGNOSIS — M25675 Stiffness of left foot, not elsewhere classified: Secondary | ICD-10-CM

## 2018-05-22 DIAGNOSIS — M79662 Pain in left lower leg: Secondary | ICD-10-CM | POA: Diagnosis not present

## 2018-05-22 NOTE — Therapy (Signed)
[facility] [address] [place], [place], [address] Phone: [phone]   Fax:  [phone]  Physical Therapy Treatment  Patient Details  Name: Jonathan [name] MRN: [id number] Date of Birth: [birth year] Referring Provider (PT): Gerda Diss   Encounter Date: 05/22/2018  PT End of Session - 05/22/18 0757    Visit Number  3    Date for PT Re-Evaluation  07/01/18    PT Start Time  0758    PT Stop Time  0840    PT Time Calculation (min)  42 min    Activity Tolerance  Patient tolerated treatment well    Behavior During Therapy  [facility] for tasks assessed/performed       Past Medical History:  Diagnosis Date  . ADD (attention deficit disorder)   . Allergy   . Asthma    hx consult Dr Annamaria Boots.  . Blood in stool 11/13/2014   painless  ? not mixed in  and gi opinoin  r no chnag ein bowel habits    . IBS (irritable bowel syndrome)     Past Surgical History:  Procedure Laterality Date  . UPPER GASTROINTESTINAL ENDOSCOPY     eval by GI neg  . WRIST SURGERY     right- March 2016    There were no vitals filed for this visit.  Subjective Assessment - 05/22/18 0758    Subjective  Ran 5 min on indoor track; could feel it, but it was not nearly as painful as it used to be.     Currently in Pain?  No/denies    Multiple Pain Sites  No         [facility] PT Assessment - 05/22/18 0001      AROM   Overall AROM Comments  Lt ankle DF 20 degress with knee straight                   [facility] Adult PT Treatment/Exercise - 05/22/18 0001      Iontophoresis   Type of Iontophoresis  Dexamethasone   #2 skin intact,    Location  Lt distal to lat malleolus    Dose  1 ml    Time  6 hour wear   Pt verbalizes skin precautions     Manual Therapy   Manual Therapy  Soft tissue mobilization    Soft tissue mobilization  LT lateral gastroc soft tissue work including trigger point work: Regulatory affairs officer to later  [employer] and upgrading to [employer] calf , light feathering around lat malleolus      Ankle Exercises: Stretches   Set designer  --   4x 20 sec: VC for posture     Ankle Exercises: Standing   SLS  LTLE 3x 20 sec   Folded towel medial foot, VC for postrure   Other Standing Ankle Exercises  Donkey kicks for glutes bil 10x3 0#    added to HEP     Ankle Exercises: Supine   T-Band  red band DF 2x10, added to HEP               PT Short Term Goals - 05/22/18 0817      PT SHORT TERM GOAL #1   Title  pt will report 25% less pain    Time  4    Period  Weeks    Status  Achieved   50%-60%     PT SHORT TERM GOAL #2   Title  Pt will be ind with initial HEP    Time  4    Period  Weeks    Status  Achieved        PT Long Term Goals - 05/22/18 0818      PT LONG TERM GOAL #4   Title  Pt will be able to begin running slow and short distances without increased pain    Time  8    Period  Weeks    Status  On-going            Plan - 05/22/18 0809    Clinical Impression Statement  Pt presents today with no pain. He did a 5 min jog last week on the indoor track with no pain last week. Lateral gastroc soft tissues much improved with one small area of dense congestion and tender trigger point. Hip musculature still very visably shakey during single leg stance exercises. Pt is compliant with thiese exercises for HEP. All short term goals met this week, including improving his dorsiflexion to 20 degrees active.      Rehab Potential  Excellent    PT Frequency  1x / week    PT Duration  8 weeks    PT Treatment/Interventions  ADLs/Self Care Home Management;Biofeedback;Cryotherapy;Electrical Stimulation;Iontophoresis '4mg'$ /ml Dexamethasone;Moist Heat    PT Next Visit Plan  ionto #3 continue with LT hip strength, soft tissue work to lateral gastroc.    PT Home Exercise Plan  Access Code: [email or web address]     Consulted and Agree with Plan of Care  Patient       Patient will benefit from  skilled therapeutic intervention in order to improve the following deficits and impairments:  Abnormal gait, Increased muscle spasms, Decreased range of motion, Increased fascial restricitons  Visit Diagnosis: Stiffness of left foot, not elsewhere classified  Pain in left lower leg  Muscle spasm of calf     Problem List Patient Active Problem List   Diagnosis Date Noted  . Peroneal tendinitis of left lower extremity 04/10/2018  . Allergic conjunctivitis and rhinitis 07/08/2014  . Visit for preventive health examination 06/05/2014  . Right wrist pain 12/05/2013  . Medication management 11/18/2012  . Dysplastic nevus 01/02/2011  . Atypical moles 12/21/2010  . Routine general medical examination at a health care facility 07/19/2010  . ASTHMA 09/22/2007  . ALLERGIC RHINITIS 09/16/2007  . Attention deficit disorder 11/27/2006  . IRRITABLE BOWEL SYNDROME 11/27/2006    Aadil Sur, PTA 05/22/2018, 8:40 AM  [facility] [address] [place], [place], [address] Phone: [phone]   Fax:  [phone]  Name: Jonathan [name] MRN: [id number] Date of Birth: [birth year]  Access Code: [email or web address]  URL: [email or web address]  Date: 05/22/2018  Prepared by: Myrene Galas   Exercises  Seated Calf Stretch with Strap - 3 reps - 1 sets - 30 sec hold - 1x daily - 7x weekly  Standing Hip Extension with Leg Bent and Support - 10 reps - 3x daily - 7x weekly  Standing Single Leg Heel Raise - 1 reps - 1 sets - 30 hold - 3x daily - 7x weekly  Long Sitting Ankle Dorsiflexion with Anchored Resistance - 10 reps - 2 sets - 2x daily - 7x weekly

## 2018-05-29 ENCOUNTER — Ambulatory Visit: Payer: BLUE CROSS/BLUE SHIELD | Attending: Sports Medicine | Admitting: Physical Therapy

## 2018-05-29 ENCOUNTER — Encounter: Payer: Self-pay | Admitting: Physical Therapy

## 2018-05-29 DIAGNOSIS — M62831 Muscle spasm of calf: Secondary | ICD-10-CM | POA: Diagnosis not present

## 2018-05-29 DIAGNOSIS — M25675 Stiffness of left foot, not elsewhere classified: Secondary | ICD-10-CM | POA: Diagnosis not present

## 2018-05-29 DIAGNOSIS — M79662 Pain in left lower leg: Secondary | ICD-10-CM

## 2018-05-29 NOTE — Therapy (Signed)
[facility] [address] [place], [place], [address] Phone: [phone]   Fax:  [phone]  Physical Therapy Treatment  Patient Details  Name: Jonathan [name] MRN: [id number] Date of Birth: 1980/07/28 Referring Provider (PT): Gerda Diss   Encounter Date: 05/29/2018  PT End of Session - 05/29/18 0758    Visit Number  4    Date for PT Re-Evaluation  07/01/18    PT Start Time  0758    PT Stop Time  0836    PT Time Calculation (min)  38 min    Activity Tolerance  Patient tolerated treatment well    Behavior During Therapy  [facility] for tasks assessed/performed       Past Medical History:  Diagnosis Date  . ADD (attention deficit disorder)   . Allergy   . Asthma    hx consult Dr Annamaria Boots.  . Blood in stool 11/13/2014   painless  ? not mixed in  and gi opinoin  r no chnag ein bowel habits    . IBS (irritable bowel syndrome)     Past Surgical History:  Procedure Laterality Date  . UPPER GASTROINTESTINAL ENDOSCOPY     eval by GI neg  . WRIST SURGERY     right- March 2016    There were no vitals filed for this visit.  Subjective Assessment - 05/29/18 0759    Subjective  Up to running 7 min pain free.     Currently in Pain?  No/denies    Multiple Pain Sites  No                       [facility] Adult PT Treatment/Exercise - 05/29/18 0001      Iontophoresis   Type of Iontophoresis  Dexamethasone   #3 skin intact,    Location  Lt distal to lat malleolus    Dose  1 ml    Time  6 hour wear   Pt verbalizes skin precautions     Manual Therapy   Manual Therapy  Soft tissue mobilization    Soft tissue mobilization  LT lateral gastroc soft tissue work including trigger point work: Regulatory affairs officer to later [employer] and upgrading to [employer] calf , light feathering around lat malleolus      Ankle Exercises: Stretches   Set designer  --   4x 30 sec: VC for posture     Ankle  Exercises: Sidelying   Other Sidelying Ankle Exercises  Green band hip clamshells 2x10, TC/VC for correct technique      Ankle Exercises: Standing   SLS  On black pad 3x 30 sec working on lessening UE assist    Heel Raises  --   LT > RT 10x 2   Other Standing Ankle Exercises  Donkey kicks 2# added 2x15 Bil   VC for stronger single leg stance            PT Education - 05/29/18 0817    Education Details  S/L hip ER for HEP with green band    Person(s) Educated  Patient    Methods  Explanation;Demonstration;Tactile cues;Handout;Verbal cues    Comprehension  Returned demonstration;Verbalized understanding       PT Short Term Goals - 05/22/18 0817      PT SHORT TERM GOAL #1   Title  pt will report 25% less pain    Time  4    Period  Weeks  Status  Achieved   50%-60%     PT SHORT TERM GOAL #2   Title  Pt will be ind with initial HEP    Time  4    Period  Weeks    Status  Achieved        PT Long Term Goals - 05/22/18 0818      PT LONG TERM GOAL #4   Title  Pt will be able to begin running slow and short distances without increased pain    Time  8    Period  Weeks    Status  On-going            Plan - 05/29/18 0759    Clinical Impression Statement  Pt presents today without complaints of pain. In addition he ran on the indoor track 7 min pain free this week. Lateral gastroc conitnues to improves its mobility and lessen in its thickness with adjacent trigger points.  Pt was able to single leg stance on noncompliant surface without pain ( still visable shakiness) in addition to adding light resistance to his glute strengthening.     Rehab Potential  Excellent    PT Frequency  1x / week    PT Duration  8 weeks    PT Treatment/Interventions  ADLs/Self Care Home Management;Biofeedback;Cryotherapy;Electrical Stimulation;Iontophoresis 4mg /ml Dexamethasone;Moist Heat    PT Next Visit Plan  ionto #4, see what MD says about return to running.     PT Home Exercise  Plan  Access Code: [email or web address]     Consulted and Agree with Plan of Care  Patient       Patient will benefit from skilled therapeutic intervention in order to improve the following deficits and impairments:  Abnormal gait, Increased muscle spasms, Decreased range of motion, Increased fascial restricitons  Visit Diagnosis: Stiffness of left foot, not elsewhere classified  Pain in left lower leg  Muscle spasm of calf     Problem List Patient Active Problem List   Diagnosis Date Noted  . Peroneal tendinitis of left lower extremity 04/10/2018  . Allergic conjunctivitis and rhinitis 07/08/2014  . Visit for preventive health examination 06/05/2014  . Right wrist pain 12/05/2013  . Medication management 11/18/2012  . Dysplastic nevus 01/02/2011  . Atypical moles 12/21/2010  . Routine general medical examination at a health care facility 07/19/2010  . ASTHMA 09/22/2007  . ALLERGIC RHINITIS 09/16/2007  . Attention deficit disorder 11/27/2006  . IRRITABLE BOWEL SYNDROME 11/27/2006    [name], PTA 05/29/2018, 8:38 AM  [facility] [address] [place], [place], [address] Phone: [phone]   Fax:  [phone]  Name: Jonathan [name] MRN: [id number] Date of Birth: June 04, 1980  Access Code: [email or web address]  URL: [email or web address]  Date: 05/29/2018  Prepared by: Myrene Galas   Exercises  Seated Calf Stretch with Strap - 3 reps - 1 sets - 30 sec hold - 1x daily - 7x weekly  Standing Hip Extension with Leg Bent and Support - 10 reps - 3x daily - 7x weekly  Standing Single Leg Heel Raise - 1 reps - 1 sets - 30 hold - 3x daily - 7x weekly  Long Sitting Ankle Dorsiflexion with Anchored Resistance - 10 reps - 2 sets - 2x daily - 7x weekly  Clamshell with Resistance - 15 reps - 2 sets - 3 hold - 1x daily - 7x weekly

## 2018-05-30 ENCOUNTER — Encounter: Payer: Self-pay | Admitting: Sports Medicine

## 2018-05-30 ENCOUNTER — Ambulatory Visit: Payer: BLUE CROSS/BLUE SHIELD | Admitting: Sports Medicine

## 2018-05-30 VITALS — BP 126/68 | HR 87 | Wt 171.4 lb

## 2018-05-30 DIAGNOSIS — M792 Neuralgia and neuritis, unspecified: Secondary | ICD-10-CM

## 2018-05-30 DIAGNOSIS — M7672 Peroneal tendinitis, left leg: Secondary | ICD-10-CM

## 2018-05-30 DIAGNOSIS — M79672 Pain in left foot: Secondary | ICD-10-CM

## 2018-06-02 ENCOUNTER — Encounter: Payer: Self-pay | Admitting: Sports Medicine

## 2018-06-02 NOTE — Progress Notes (Signed)
Jonathan [name]. Jonathan [name], [name]  Jonathan [name] - 38 y.o. male MRN [id number]  Date of birth: 09-02-1980  Visit Date: 05/30/2018  PCP: Burnis Medin, MD   Referred by: Burnis Medin, MD  SUBJECTIVE:   Chief Complaint  Patient presents with  . Follow-up    left heel pain doing better     HPI: Patient is here for follow-up of his left heel and peroneal pain.  He has had great improvement with the gabapentin.  He is continue with the CEP compression sleeve and intermittent Duexis.  Physical therapy and dry needling has been helpful.  He is not having any nighttime disturbances.  Otherwise 12 point review of systems is negative other than numbness in the left heel.  REVIEW OF SYSTEMS: Per HPI  HISTORY:  Prior history reviewed and updated per electronic medical record.  Patient Active Problem List   Diagnosis Date Noted  . Peroneal tendinitis of left lower extremity 04/10/2018    L calcaneus XR - 04/23/18  L ankle MRI - 04/29/18   . Allergic conjunctivitis and rhinitis 07/08/2014    seasonal itching  throoughout upper  add INCS contoller  and  eye drops for now  consider short dourse pred and singulair if needed .expectant  management,    . Visit for preventive health examination 06/05/2014  . Right wrist pain 12/05/2013    newonset tennis active more than passive  painful hold nad shake hands  refer ortho sports med nsaid rel rest for now    . Medication management 11/18/2012  . Dysplastic nevus 01/02/2011    bx per Dr Tonia Brooms   . Atypical moles 12/21/2010  . Routine general medical examination at a health care facility 07/19/2010  . ASTHMA 09/22/2007    Qualifier: Diagnosis of  By: Annamaria Boots MD, Clinton D    . ALLERGIC RHINITIS 09/16/2007    Qualifier: Diagnosis of  By: Julien Girt CMA, Marliss Czar     . Attention deficit disorder 11/27/2006    Qualifier: Diagnosis of  By: Scherrie Gerlach     . IRRITABLE  BOWEL SYNDROME 11/27/2006    Qualifier: Diagnosis of  By: Sherren Mocha RN, Dorian Pod      Social History   Occupational History  . Not on file  Tobacco Use  . Smoking status: Never Smoker  . Smokeless tobacco: Current User    Types: Chew  Substance and Sexual Activity  . Alcohol use: Yes    Alcohol/week: 0.0 standard drinks    Comment: socially  . Drug use: No  . 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    No pets   Regular exercise- yes runner 2 x per week  Lift 2 x per week    No ets.     OBJECTIVE:  VS:  HT:    WT:171 lb 6.4 oz (77.7 kg)  BMI:25.5    BP:126/68  HR:87bpm  TEMP: ( )  RESP:98 %   PHYSICAL EXAM: Adult male.  No acute distress.  Alert appropriate. His left ankle is overall well aligned.  He has good ankle mobility.  His ankle strength is normal.  Gait assessment on the treadmill does show a neutral gait with appropriate  pronation and supination.  He is a [employer] scheme manager.   ASSESSMENT:   1. Peroneal tendinitis of left lower extremity   2. Neuralgia and neuritis   3. Pain of left heel     PROCEDURES:  None  PLAN:  Pertinent additional documentation may be included in corresponding procedure notes, imaging studies, problem based documentation and patient instructions.  No problem-specific Assessment & Plan notes found for this encounter.   Ultimately his gait is neutral.  This does seem to be responding well to treatment for Baxter's neuralgia.  Continue with gabapentinoids and physical therapy.  Activity modifications and the importance of avoiding exacerbating activities (limiting pain to no more than a 4 / 10 during or following activity) recommended and discussed.  Discussed red flag symptoms that warrant earlier emergent evaluation and patient voices understanding.  >50% of this 25 minute  visit spent in direct patient counseling and/or coordination of care.  Discussion was focused on education regarding the in discussing the pathoetiology and anticipated clinical course of the above condition.  No orders of the defined types were placed in this encounter.  Lab Orders  No laboratory test(s) ordered today   Imaging Orders  No imaging studies ordered today   Referral Orders  No referral(s) requested today     No follow-ups on file.  Will need to consider discontinuation of gabapentin at follow-up.         Gerda Diss, [facility] Physician

## 2018-06-05 ENCOUNTER — Ambulatory Visit: Payer: BLUE CROSS/BLUE SHIELD | Admitting: Physical Therapy

## 2018-06-05 ENCOUNTER — Other Ambulatory Visit: Payer: Self-pay

## 2018-06-05 ENCOUNTER — Encounter: Payer: Self-pay | Admitting: Physical Therapy

## 2018-06-05 DIAGNOSIS — M79662 Pain in left lower leg: Secondary | ICD-10-CM

## 2018-06-05 DIAGNOSIS — M25675 Stiffness of left foot, not elsewhere classified: Secondary | ICD-10-CM | POA: Diagnosis not present

## 2018-06-05 DIAGNOSIS — M62831 Muscle spasm of calf: Secondary | ICD-10-CM

## 2018-06-05 NOTE — Therapy (Signed)
[facility] [address] [place], [place], [address] Phone: [phone]   Fax:  [phone]  Physical Therapy Treatment  Patient Details  Name: Jonathan [name] MRN: [id number] Date of Birth: 01-Aug-1980 Referring Provider (PT): Gerda Diss   Encounter Date: 06/05/2018  PT End of Session - 06/05/18 0800    Visit Number  5    Date for PT Re-Evaluation  07/01/18    PT Start Time  0800    PT Stop Time  0838    PT Time Calculation (min)  38 min    Activity Tolerance  Patient tolerated treatment well    Behavior During Therapy  [facility] for tasks assessed/performed       Past Medical History:  Diagnosis Date  . ADD (attention deficit disorder)   . Allergy   . Asthma    hx consult Dr Annamaria Boots.  . Blood in stool 11/13/2014   painless  ? not mixed in  and gi opinoin  r no chnag ein bowel habits    . IBS (irritable bowel syndrome)     Past Surgical History:  Procedure Laterality Date  . UPPER GASTROINTESTINAL ENDOSCOPY     eval by GI neg  . WRIST SURGERY     right- March 2016    There were no vitals filed for this visit.  Subjective Assessment - 06/05/18 0800    Subjective  Ran 2 1/2 mile intervals of jog/walk yesterday with no pain. MD pleased, DC next visit.     Currently in Pain?  No/denies                       [facility] Adult PT Treatment/Exercise - 06/05/18 0001      Iontophoresis   Type of Iontophoresis  Dexamethasone   #4 skin intact,    Location  Lt distal to lat malleolus    Dose  1 ml    Time  6 hour wear   Pt verbalizes skin precautions     Manual Therapy   Manual Therapy  Soft tissue mobilization    Soft tissue mobilization  LT lateral gastroc soft tissue work including trigger point work: Regulatory affairs officer to later [employer] and upgrading to [employer] calf , light feathering around lat malleolus      Ankle Exercises: Stretches   Set designer  --   4x 30 sec: VC for  posture     Ankle Exercises: Standing   SLS  On black pad 3x 30 sec working on lessening UE assist   Added ball toss 20x 3 today   Heel Raises  --   LT > RT 15x 2   Other Standing Ankle Exercises  Donkey kicks in quadruped today 3x 10 3#               PT Short Term Goals - 05/22/18 0817      PT SHORT TERM GOAL #1   Title  pt will report 25% less pain    Time  4    Period  Weeks    Status  Achieved   50%-60%     PT SHORT TERM GOAL #2   Title  Pt will be ind with initial HEP    Time  4    Period  Weeks    Status  Achieved        PT Long Term Goals - 05/22/18 0818      PT LONG  TERM GOAL #4   Title  Pt will be able to begin running slow and short distances without increased pain    Time  8    Period  Weeks    Status  On-going            Plan - 06/05/18 0800    Clinical Impression Statement  Pt saw MD last week, received a good report. Yesterday pt was able to run 2 1/2 miles ( jog/walk) painfree. [name], some verbal cuing to find his medial foot more.     Rehab Potential  Excellent    PT Frequency  1x / week    PT Duration  8 weeks    PT Treatment/Interventions  ADLs/Self Care Home Management;Biofeedback;Cryotherapy;Electrical Stimulation;Iontophoresis 4mg /ml Dexamethasone;Moist Heat    PT Next Visit Plan  DC next visit, FOTO, MMT ankle for goals,     PT Home Exercise Plan  Access Code: [email or web address]     Consulted and Agree with Plan of Care  Patient       Patient will benefit from skilled therapeutic intervention in order to improve the following deficits and impairments:  Abnormal gait, Increased muscle spasms, Decreased range of motion, Increased fascial restricitons  Visit Diagnosis: Stiffness of left foot, not elsewhere classified  Pain in left lower leg  Muscle spasm of calf     Problem List Patient Active Problem  List   Diagnosis Date Noted  . Peroneal tendinitis of left lower extremity 04/10/2018  . Allergic conjunctivitis and rhinitis 07/08/2014  . Visit for preventive health examination 06/05/2014  . Right wrist pain 12/05/2013  . Medication management 11/18/2012  . Dysplastic nevus 01/02/2011  . Atypical moles 12/21/2010  . Routine general medical examination at a health care facility 07/19/2010  . ASTHMA 09/22/2007  . ALLERGIC RHINITIS 09/16/2007  . Attention deficit disorder 11/27/2006  . IRRITABLE BOWEL SYNDROME 11/27/2006    [name], PTA 06/05/2018, 8:40 AM  [facility] [address] [place], [place], [address] Phone: [phone]   Fax:  [phone]  Name: Jonathan [name] MRN: [id number] Date of Birth: December 24, 1980

## 2018-06-06 ENCOUNTER — Other Ambulatory Visit: Payer: Self-pay | Admitting: Internal Medicine

## 2018-06-06 MED ORDER — AMPHETAMINE-DEXTROAMPHET ER 25 MG PO CP24: 25.0000 mg | ORAL_CAPSULE | ORAL | 0 refills | Status: DC

## 2018-06-06 NOTE — Telephone Encounter (Signed)
Copied from [place] [phone]. Topic: Quick Communication - Rx Refill/Question >> Jun 06, 2018 10:45 AM Jonathan [name] wrote: Medication: amphetamine-dextroamphetamine (ADDERALL XR) 25 MG 24 hr capsule - Last time Dr. Quay Burow called in 3 one month supplies and Pt would like it called in the same way if possible   Has the patient contacted their pharmacy? Yes - call pcp   Preferred Pharmacy (with phone number or street name): [id number] - [place], [place] - [address] [place] [phone] (Phone) [phone] (Fax)    Agent: Please be advised that RX refills may take up to 3 business days. We ask that you follow-up with your pharmacy.

## 2018-06-06 NOTE — Telephone Encounter (Signed)
Last filled:03/15/18 Last OV:02/14/18 Upcoming appt:08/14/17

## 2018-06-12 ENCOUNTER — Encounter: Payer: Self-pay | Admitting: Physical Therapy

## 2018-06-12 ENCOUNTER — Other Ambulatory Visit: Payer: Self-pay

## 2018-06-12 ENCOUNTER — Ambulatory Visit: Payer: BLUE CROSS/BLUE SHIELD | Admitting: Physical Therapy

## 2018-06-12 DIAGNOSIS — M25675 Stiffness of left foot, not elsewhere classified: Secondary | ICD-10-CM | POA: Diagnosis not present

## 2018-06-12 DIAGNOSIS — M79662 Pain in left lower leg: Secondary | ICD-10-CM

## 2018-06-12 DIAGNOSIS — M62831 Muscle spasm of calf: Secondary | ICD-10-CM | POA: Diagnosis not present

## 2018-06-12 NOTE — Therapy (Addendum)
[facility] [address] [place], [place], [address] Phone: [phone]   Fax:  [phone]  Physical Therapy Treatment  Patient Details  Name: Jonathan [name] MRN: [id number] Date of Birth: [birth year] Referring Provider (PT): Gerda Diss   Encounter Date: 06/12/2018  PT End of Session - 06/12/18 0801    Visit Number  6    Date for PT Re-Evaluation  07/01/18    PT Start Time  0759    PT Stop Time  0828    PT Time Calculation (min)  29 min    Activity Tolerance  Patient tolerated treatment well    Behavior During Therapy  [facility] for tasks assessed/performed       Past Medical History:  Diagnosis Date  . ADD (attention deficit disorder)   . Allergy   . Asthma    hx consult Dr Annamaria Boots.  . Blood in stool 11/13/2014   painless  ? not mixed in  and gi opinoin  r no chnag ein bowel habits    . IBS (irritable bowel syndrome)     Past Surgical History:  Procedure Laterality Date  . UPPER GASTROINTESTINAL ENDOSCOPY     eval by GI neg  . WRIST SURGERY     right- March 2016    There were no vitals filed for this visit.  Subjective Assessment - 06/12/18 0802    Subjective  Running ok, no pain.    Currently in Pain?  No/denies    Multiple Pain Sites  No         [facility] PT Assessment - 06/12/18 0001      Observation/Other Assessments   Focus on Therapeutic Outcomes (FOTO)   1% limitation      AROM   Overall AROM Comments  Lt ankle DF 19,                    [facility] Adult PT Treatment/Exercise - 06/12/18 0001      Ankle Exercises: Stretches   Slant Board Stretch  --   4x 30 sec: VC for posture   Other Stretch  RT glute stretch post donkey kicks      Ankle Exercises: Standing   SLS  On black pad 3x 30 sec working on lessening UE assist   Added ball toss 20x 3 today   Other Standing Ankle Exercises  Donkey kicks in quadruped today 3x 10 3#   Pt independent in exercise              PT  Short Term Goals - 05/22/18 0817      PT SHORT TERM GOAL #1   Title  pt will report 25% less pain    Time  4    Period  Weeks    Status  Achieved   50%-60%     PT SHORT TERM GOAL #2   Title  Pt will be ind with initial HEP    Time  4    Period  Weeks    Status  Achieved        PT Long Term Goals - 06/12/18 0805      PT LONG TERM GOAL #1   Title  pt will be ind with final HEP to safely work towards return to running    Time  8    Period  Weeks    Status  Achieved      PT LONG TERM GOAL #2   Title  Pt  will have 5 deg of AROM dorsiflexion with knee in full extension for improved ROM with running    Time  8    Period  Weeks    Status  Achieved      PT LONG TERM GOAL #3   Title  pt will have full and pain free AROM of left ankle    Time  8    Period  Weeks    Status  Achieved      PT LONG TERM GOAL #4   Title  Pt will be able to begin running slow and short distances without increased pain    Time  8    Period  Weeks    Status  Achieved      PT LONG TERM GOAL #5   Title  FOTO < or = to 24% limited    Time  8    Period  Weeks    Status  Achieved   1%           Plan - 06/12/18 0802    Clinical Impression Statement  Pt is continuing to progress his run/walk protocol with success, in fact pain free. He has full, painfree ROM and full strength of his LT ankle. He can single leg stance with ball toss painfree in addition to his other strengthing exercises. (HEP).  FOTO score is met as are all other goals.     Rehab Potential  Excellent    PT Frequency  1x / week    PT Duration  8 weeks    PT Treatment/Interventions  ADLs/Self Care Home Management;Biofeedback;Cryotherapy;Electrical Stimulation;Iontophoresis 76m/ml Dexamethasone;Moist Heat    PT Next Visit Plan  Discharge to HEP    PT Home Exercise Plan  Access Code: [email or web address]    Consulted and Agree with Plan of Care  Patient       Patient will benefit from skilled therapeutic intervention in order to improve  the following deficits and impairments:  Abnormal gait, Increased muscle spasms, Decreased range of motion, Increased fascial restricitons  Visit Diagnosis: Pain in left lower leg  Stiffness of left foot, not elsewhere classified  Muscle spasm of calf     Problem List Patient Active Problem List   Diagnosis Date Noted  . Peroneal tendinitis of left lower extremity 04/10/2018  . Allergic conjunctivitis and rhinitis 07/08/2014  . Visit for preventive health examination 06/05/2014  . Right wrist pain 12/05/2013  . Medication management 11/18/2012  . Dysplastic nevus 01/02/2011  . Atypical moles 12/21/2010  . Routine general medical examination at a health care facility 07/19/2010  . ASTHMA 09/22/2007  . ALLERGIC RHINITIS 09/16/2007  . Attention deficit disorder 11/27/2006  . IRRITABLE BOWEL SYNDROME 11/27/2006    COCHRAN,JENNIFER, PTA 06/12/2018, 8:38 AM  [facility] [address] [place] [address]: [phone]  Fax:  [phone] Name: Jonathan BRUTUSMRN: [id number] of Birth: [birth year] PHYSICAL THERAPY DISCHARGE SUMMARY  Visits from Start of Care: 6  Current functional level related to goals / functional outcomes: See above goals   Remaining deficits: See above   Education / Equipment: HEP  Plan: Patient agrees to discharge.  Patient goals were met. Patient is being discharged due to meeting the stated rehab goals.  ?????    [employer] PT 06/12/18 9:00 AM

## 2018-06-19 ENCOUNTER — Encounter: Payer: BLUE CROSS/BLUE SHIELD | Admitting: Physical Therapy

## 2018-06-20 MED ORDER — AZELASTINE HCL 0.05 % OP SOLN: 1.0000 [drp] | Freq: Two times a day (BID) | OPHTHALMIC | 3 refills | Status: DC

## 2018-06-26 ENCOUNTER — Encounter: Payer: BLUE CROSS/BLUE SHIELD | Admitting: Physical Therapy

## 2018-07-03 ENCOUNTER — Encounter: Payer: BLUE CROSS/BLUE SHIELD | Admitting: Physical Therapy

## 2018-07-08 ENCOUNTER — Encounter: Payer: Self-pay | Admitting: Sports Medicine

## 2018-07-08 ENCOUNTER — Other Ambulatory Visit: Payer: Self-pay

## 2018-07-08 MED ORDER — AMPHETAMINE-DEXTROAMPHET ER 25 MG PO CP24: 25.0000 mg | ORAL_CAPSULE | ORAL | 0 refills | Status: DC

## 2018-07-08 NOTE — Telephone Encounter (Signed)
Sent in electronically . Has fu rov in May

## 2018-07-08 NOTE — Telephone Encounter (Signed)
Please advise 

## 2018-07-11 ENCOUNTER — Ambulatory Visit (INDEPENDENT_AMBULATORY_CARE_PROVIDER_SITE_OTHER): Payer: BLUE CROSS/BLUE SHIELD | Admitting: Sports Medicine

## 2018-07-11 ENCOUNTER — Ambulatory Visit: Payer: BLUE CROSS/BLUE SHIELD | Admitting: Sports Medicine

## 2018-07-11 ENCOUNTER — Encounter: Payer: Self-pay | Admitting: Sports Medicine

## 2018-07-11 DIAGNOSIS — M79671 Pain in right foot: Secondary | ICD-10-CM | POA: Diagnosis not present

## 2018-07-11 DIAGNOSIS — M792 Neuralgia and neuritis, unspecified: Secondary | ICD-10-CM | POA: Diagnosis not present

## 2018-07-11 DIAGNOSIS — M7672 Peroneal tendinitis, left leg: Secondary | ICD-10-CM | POA: Diagnosis not present

## 2018-07-11 DIAGNOSIS — M79672 Pain in left foot: Secondary | ICD-10-CM | POA: Diagnosis not present

## 2018-07-11 NOTE — Progress Notes (Signed)
Jonathan [name]. [name], [name] [facility] [place]  Jonathan [name] - 38 y.o. male MRN [id number]  Date of birth: 1980-08-17  Visit Date: 07/11/2018  PCP: Burnis Medin, MD   Referred by: Burnis Medin, MD   Virtual Visit via Video  I connected with Levora Angel on 07/14/18 at 10:40 AM EDT by a video enabled telemedicine application and verified that I am speaking with the correct person using two identifiers. Location patient: Home Location provider: Provider office Persons participating in the virtual visit: Patient only  I discussed the limitations of evaluation and management by telemedicine and the availability of in person appointments. The patient expressed understanding and agreed to proceed.   SUBJECTIVE:   Chief Complaint  Patient presents with  . Follow-up    L heel pain and peroneal tendinopathy.  Gabapentin. Duexis.  CEP compression.  PT w/ dry needling    HPI: Patient reports feeling 90% improved at this time.  He continues to take gabapentin at nighttime and is not having any side effects to this.  He has returned to running on a fairly regular basis but has not returned to his prior baseline.  He is only taking anti-inflammatories.  He had one session of dry needling before discontinuing due to the COVID-19 crisis.  REVIEW OF SYSTEMS: No significant nighttime awakenings due to this issue. Denies fevers, chills, recent weight gain or weight loss.  No night sweats.  Pt denies any change in bowel or bladder habits, muscle weakness, numbness or falls associated with this pain.  HISTORY:  Prior history reviewed and updated per electronic medical record.  Patient Active Problem List   Diagnosis Date Noted  . Peroneal tendinitis of left lower extremity 04/10/2018    L calcaneus XR - 04/23/18  L ankle MRI - 04/29/18   . Allergic conjunctivitis and rhinitis 07/08/2014    seasonal itching  throoughout upper  add  INCS contoller  and  eye drops for now  consider short dourse pred and singulair if needed .expectant  management,    . Visit for preventive health examination 06/05/2014  . Right wrist pain 12/05/2013    newonset tennis active more than passive  painful hold nad shake hands  refer ortho sports med nsaid rel rest for now    . Medication management 11/18/2012  . Dysplastic nevus 01/02/2011    bx per Dr Tonia Brooms   . Atypical moles 12/21/2010  . Routine general medical examination at a health care facility 07/19/2010  . ASTHMA 09/22/2007    Qualifier: Diagnosis of  By: Annamaria Boots MD, Clinton D    . ALLERGIC RHINITIS 09/16/2007    Qualifier: Diagnosis of  By: Julien Girt CMA, Marliss Czar     . Attention deficit disorder 11/27/2006    Qualifier: Diagnosis of  By: Scherrie Gerlach     . IRRITABLE BOWEL SYNDROME 11/27/2006    Qualifier: Diagnosis of  By: Sherren Mocha RN, Dorian Pod      Social History   Occupational History  . Not on file  Tobacco Use  . Smoking status: Never Smoker  . Smokeless tobacco: Current User    Types: Chew  Substance and Sexual Activity  . Alcohol use: Yes    Alcohol/week: 0.0 standard drinks    Comment: socially  . Drug use: No  . Sexual activity: Not on file   Social History   Social History Narrative   Occupation: 40 hours per week ( prev  [employer])  Now works  for [employer]    Single   HH of 1  No ets.    No pets   Regular exercise- yes runner 2 x per week  Lift 2 x per week    No ets.     OBJECTIVE:  VS:  HT:    WT:(Not taken due to virtual visit)  BMI:     BP:(Not taken due to virtual visit)  HR:(Not taken due to virtual visit)bpm  TEMP:(Not taken due to virtual visit)( )  RESP:(Not taken due to virtual visit)   PHYSICAL EXAM: GENERAL: Alert, appears well and in no acute distress. HEENT: Atraumatic, conjunctiva clear, no obvious abnormalities on inspection of external nose and ears. NECK: Normal movements of the head and neck.  CARDIOPULMONARY: No increased WOB. Speaking in clear sentences. I:E ratio WNL.  MS: Moves all visible extremities without noticeable abnormality. PSYCH: Pleasant and cooperative, well-groomed. Speech normal rate and rhythm. Affect is appropriate. Insight and judgement are appropriate. Attention is focused, linear, and appropriate.  NEURO: CN grossly intact. Oriented as arrived to appointment on time with no prompting. Moves both UE equally.  SKIN: No obvious lesions, wounds, erythema, or cyanosis noted on face or hands.   ASSESSMENT:   1. Peroneal tendinitis of left lower extremity   2. Neuralgia and neuritis   3. Pain of left heel   4. Pain of right heel     PROCEDURES:  None  PLAN:  Pertinent additional documentation may be included in corresponding procedure notes, imaging studies, problem based documentation and patient instructions.  No problem-specific Assessment & Plan notes found for this encounter.   Overall he is remarkably better.  We had a long discussion today around appropriateness for return to running and return protocols for training.  We will have him continue with the gabapentin at several weeks and see if he is able to cut back otherwise some additional 6 weeks may be required.  He will touch base with any issues with weaning off the gabapentin.  He is doing quite well and should continue with some therapeutic exercises and custom cushion insoles previously prescribed  Home Therapeutic Exercises: Continue previously prescribed home exercise program  No orders of the defined types were placed in this encounter.  Lab Orders  No laboratory test(s) ordered today   Imaging Orders  No imaging studies ordered today   Referral Orders  No referral(s) requested today    Return if symptoms worsen or fail to improve.          Gerda Diss, [facility] Physician

## 2018-08-04 ENCOUNTER — Other Ambulatory Visit: Payer: Self-pay

## 2018-08-05 MED ORDER — AMPHETAMINE-DEXTROAMPHET ER 25 MG PO CP24: 25.0000 mg | ORAL_CAPSULE | ORAL | 0 refills | Status: DC

## 2018-08-05 NOTE — Telephone Encounter (Signed)
Sent in electronically .  Has upcoming appt in   A week

## 2018-08-05 NOTE — Telephone Encounter (Signed)
Please advise 

## 2018-08-14 NOTE — Progress Notes (Signed)
Virtual Visit via Video Note  I connected with@ on 08/15/18 at  8:30 AM EDT by a video enabled telemedicine application and verified that I am speaking with the correct person using two identifiers. Location patient: home Location provider:home office Persons participating in the virtual visit: patient, provider  [place] national recommendations  regarding COVID 19 pandemic   video visit is advised over in office visit for this patient.  Patient aware  of the limitations of evaluation and management by telemedicine and  availability of in person appointments. and agreed to proceed.   HPI: Jonathan [name] presents for video visit  For fu med check adhd  working from home ft no changes in health foot is better ramping up physical activity   .  Neg td ocass etoh  Takes med most days and if not very unmotivated t o get things done  no se of med  Sleep 6-8 hours    ROS: See pertinent positives and negatives per HPI.  Past Medical History:  Diagnosis Date  . ADD (attention deficit disorder)   . Allergy   . Asthma    hx consult Dr Annamaria Boots.  . Blood in stool 11/13/2014   painless  ? not mixed in  and gi opinoin  r no chnag ein bowel habits    . IBS (irritable bowel syndrome)     Past Surgical History:  Procedure Laterality Date  . UPPER GASTROINTESTINAL ENDOSCOPY     eval by GI neg  . WRIST SURGERY     right- March 2016    Family History  Problem Relation Age of Onset  . Hyperlipidemia Father   . Colon cancer Neg Hx   . Esophageal cancer Neg Hx   . Rectal cancer Neg Hx   . Stomach cancer Neg Hx     Social History   Tobacco Use  . Smoking status: Never Smoker  . Smokeless tobacco: Former Systems developer    Types: Chew  Substance Use Topics  . Alcohol use: Yes    Alcohol/week: 0.0 standard drinks    Comment: socially  . Drug use: No      Current Outpatient Medications:  .  amphetamine-dextroamphetamine (ADDERALL XR) 25 MG 24 hr capsule, Take 1 capsule by mouth every morning.  Fill second, Disp: 30 capsule, Rfl: 0 .  amphetamine-dextroamphetamine (ADDERALL XR) 25 MG 24 hr capsule, Take 1 capsule by mouth every morning. Fill last, Disp: 30 capsule, Rfl: 0 .  azelastine (OPTIVAR) 0.05 % ophthalmic solution, Place 1 drop into both eyes 2 (two) times daily., Disp: 6 mL, Rfl: 3 .  gabapentin (NEURONTIN) 300 MG capsule, Start with 1 tab po qhs X 1 week, then increase to 1 tab po bid X 1 week then 1 tab po tid prn, Disp: 90 capsule, Rfl: 1 .  Ibuprofen-Famotidine (DUEXIS) 800-26.6 MG TABS, Take 1 tablet by mouth 3 (three) times daily as needed. 1 tab po tid X 14 days then 1 tab po tid as needed (Patient not taking: Reported on 07/11/2018), Disp: 90 tablet, Rfl: 2  EXAM: BP Readings from Last 3 Encounters:  05/30/18 126/68  05/02/18 138/86  04/23/18 140/68    VITALS per patient if applicable:  GENERAL: alert, oriented, appears well and in no acute distress  HEENT: atraumatic, conjunttiva clear, no obvious abnormalities on inspection of external nose and ears  NECK: normal movements of the head and neck  LUNGS: on inspection no signs of respiratory distress, breathing rate appears normal, no obvious gross SOB,  gasping or wheezing  CV: no obvious cyanosis  MS: moves all visible extremities without noticeable abnormality  PSYCH/NEURO: pleasant and cooperative, no obvious depression or anxiety, speech and thought processing grossly intact Lab Results  Component Value Date   WBC 4.4 05/28/2017   HGB 15.4 05/28/2017   HCT 44.0 05/28/2017   PLT 247.0 05/28/2017   GLUCOSE 86 05/28/2017   CHOL 223 (H) 05/28/2017   TRIG 161.0 (H) 05/28/2017   HDL 55.30 05/28/2017   LDLCALC 135 (H) 05/28/2017   ALT 32 05/28/2017   AST 23 05/28/2017   NA 139 05/28/2017   K 4.2 05/28/2017   CL 101 05/28/2017   CREATININE 0.86 05/28/2017   BUN 13 05/28/2017   CO2 31 05/28/2017   TSH 2.27 05/28/2017   HGBA1C 5.2 05/28/2017    ASSESSMENT AND PLAN:  Discussed the following  assessment and plan:  Attention deficit disorder, unspecified hyperactivity presence - Plan: Basic metabolic panel, CBC with Differential/Platelet, Hepatic function panel, Lipid panel  Medication management - Plan: Basic metabolic panel, CBC with Differential/Platelet, Hepatic function panel, Lipid panel  Hyperlipidemia, unspecified hyperlipidemia type - to do labs in the fall  - Plan: Basic metabolic panel, CBC with Differential/Platelet, Hepatic function panel, Lipid panel Benefit more than risk of medications  to continue. Continue  Counseled.  Disc  Stress of covid restriction  And pandemic changes for all humans and copting well . Ok to request 3 x 30 rx in future unti due for fu. Plan cox and lb in fall   Expectant management and discussion of plan and treatment with opportunity to ask questions and all were answered. The patient agreed with the plan and demonstrated an understanding of the instructions.   Advised to call back or seek an in-person evaluation if worsening  or having  further concerns .   Shanon Ace, MD

## 2018-08-15 ENCOUNTER — Encounter: Payer: Self-pay | Admitting: Internal Medicine

## 2018-08-15 ENCOUNTER — Other Ambulatory Visit: Payer: Self-pay

## 2018-08-15 ENCOUNTER — Ambulatory Visit (INDEPENDENT_AMBULATORY_CARE_PROVIDER_SITE_OTHER): Payer: BLUE CROSS/BLUE SHIELD | Admitting: Internal Medicine

## 2018-08-15 DIAGNOSIS — E785 Hyperlipidemia, unspecified: Secondary | ICD-10-CM

## 2018-08-15 DIAGNOSIS — Z79899 Other long term (current) drug therapy: Secondary | ICD-10-CM | POA: Diagnosis not present

## 2018-08-15 DIAGNOSIS — F988 Other specified behavioral and emotional disorders with onset usually occurring in childhood and adolescence: Secondary | ICD-10-CM | POA: Diagnosis not present

## 2018-08-30 ENCOUNTER — Other Ambulatory Visit: Payer: Self-pay

## 2018-08-30 NOTE — Telephone Encounter (Signed)
Please advise 

## 2018-09-03 MED ORDER — AMPHETAMINE-DEXTROAMPHET ER 25 MG PO CP24: 25.0000 mg | ORAL_CAPSULE | ORAL | 0 refills | Status: DC

## 2018-09-03 NOTE — Telephone Encounter (Signed)
3 x 30 has been sent in to your pharmacy

## 2018-11-25 ENCOUNTER — Other Ambulatory Visit: Payer: Self-pay

## 2018-11-26 MED ORDER — AMPHETAMINE-DEXTROAMPHET ER 25 MG PO CP24: 25.0000 mg | ORAL_CAPSULE | ORAL | 0 refills | Status: DC

## 2018-11-26 NOTE — Telephone Encounter (Signed)
See rx refill request below

## 2018-11-26 NOTE — Telephone Encounter (Signed)
Last ov:08/15/18 Last filled:08/30/2018

## 2018-11-26 NOTE — Telephone Encounter (Signed)
90 days  Sent in

## 2019-01-20 ENCOUNTER — Ambulatory Visit (INDEPENDENT_AMBULATORY_CARE_PROVIDER_SITE_OTHER): Payer: BC Managed Care – PPO

## 2019-01-20 ENCOUNTER — Other Ambulatory Visit: Payer: Self-pay

## 2019-01-20 DIAGNOSIS — Z23 Encounter for immunization: Secondary | ICD-10-CM

## 2019-01-20 MED ORDER — AMPHETAMINE-DEXTROAMPHET ER 25 MG PO CP24: 25.0000 mg | ORAL_CAPSULE | ORAL | 0 refills | Status: DC

## 2019-01-20 NOTE — Telephone Encounter (Signed)
Sent request  in electronically . Due for cpx or MEd check before next refills

## 2019-01-20 NOTE — Telephone Encounter (Signed)
Noted  

## 2019-02-17 NOTE — Telephone Encounter (Signed)
Please contact the pharmacy and tell them  They can refill the  aadderall early this week because of the holiday ( the rx refill already in system)

## 2019-04-13 ENCOUNTER — Other Ambulatory Visit: Payer: Self-pay

## 2019-04-14 ENCOUNTER — Telehealth (INDEPENDENT_AMBULATORY_CARE_PROVIDER_SITE_OTHER): Payer: BC Managed Care – PPO | Admitting: Internal Medicine

## 2019-04-14 ENCOUNTER — Encounter: Payer: Self-pay | Admitting: Internal Medicine

## 2019-04-14 ENCOUNTER — Other Ambulatory Visit: Payer: Self-pay

## 2019-04-14 DIAGNOSIS — F988 Other specified behavioral and emotional disorders with onset usually occurring in childhood and adolescence: Secondary | ICD-10-CM

## 2019-04-14 DIAGNOSIS — Z79899 Other long term (current) drug therapy: Secondary | ICD-10-CM

## 2019-04-14 MED ORDER — AMPHETAMINE-DEXTROAMPHET ER 25 MG PO CP24: 25.0000 mg | ORAL_CAPSULE | ORAL | 0 refills | Status: DC

## 2019-04-14 NOTE — Progress Notes (Signed)
Virtual Visit via Video Note  I connected with@ on 04/14/19 at 10:30 AM EST by a video enabled telemedicine application and verified that I am speaking with the correct person using two identifiers. Location patient: home Location provider:work  office Persons participating in the virtual visit: patient, provider  [place] national recommendations  regarding COVID 19 pandemic   video visit is advised over in office visit for this patient.  Patient aware  of the limitations of evaluation and management by telemedicine and  availability of in person appointments. and agreed to proceed.   HPI: Jonathan [name] presents for video visit ADHD: medication takes daily still helpful  Working from home and exercising  ocass better otherwise no  Tad  No change in health status . Foot heel left is better  Runs  thinks weight is better   Down to 166 range  Last visit may 2020 Was pl;anning cpx  But  NA in w covid scales  Working from home  Ft 40 hours    ROS: See pertinent positives and negatives per HPI. No cv pulm asthma sx   Past Medical History:  Diagnosis Date  . ADD (attention deficit disorder)   . Allergy   . Asthma    hx consult Dr Annamaria Boots.  . Blood in stool 11/13/2014   painless  ? not mixed in  and gi opinoin  r no chnag ein bowel habits    . IBS (irritable bowel syndrome)     Past Surgical History:  Procedure Laterality Date  . UPPER GASTROINTESTINAL ENDOSCOPY     eval by GI neg  . WRIST SURGERY     right- March 2016    Family History  Problem Relation Age of Onset  . Hyperlipidemia Father   . Colon cancer Neg Hx   . Esophageal cancer Neg Hx   . Rectal cancer Neg Hx   . Stomach cancer Neg Hx     Social History   Tobacco Use  . Smoking status: Never Smoker  . Smokeless tobacco: Former Systems developer    Types: Chew  Substance Use Topics  . Alcohol use: Yes    Alcohol/week: 0.0 standard drinks    Comment: socially  . Drug use: No       EXAM: BP Readings from Last 3  Encounters:  05/30/18 126/68  05/02/18 138/86  04/23/18 140/68    VITALS per patient if applicable: weight [id number]   GENERAL: alert, oriented, appears well and in no acute distress  HEENT: atraumatic, conjunttiva clear, no obvious abnormalities on inspection of external nose and ears  NECK: normal movements of the head and neck  LUNGS: on inspection no signs of respiratory distress, breathing rate appears normal, no obvious gross SOB, gasping or wheezing  CV: no obvious cyanosis   PSYCH/NEURO: pleasant and cooperative, no obvious depression or anxiety, speech and thought processing grossly intact Lab Results  Component Value Date   WBC 4.4 05/28/2017   HGB 15.4 05/28/2017   HCT 44.0 05/28/2017   PLT 247.0 05/28/2017   GLUCOSE 86 05/28/2017   CHOL 223 (H) 05/28/2017   TRIG 161.0 (H) 05/28/2017   HDL 55.30 05/28/2017   LDLCALC 135 (H) 05/28/2017   ALT 32 05/28/2017   AST 23 05/28/2017   NA 139 05/28/2017   K 4.2 05/28/2017   CL 101 05/28/2017   CREATININE 0.86 05/28/2017   BUN 13 05/28/2017   CO2 31 05/28/2017   TSH 2.27 05/28/2017   HGBA1C 5.2 05/28/2017  ASSESSMENT AND PLAN:  Discussed the following assessment and plan:    ICD-10-CM   1. Attention deficit disorder, unspecified hyperactivity presence  F98.8   2. Medication management  Z79.899    Get on schedule for cpx in 6 mos  Can call for this  Lab orders are in the system can get  Lab appt also  Benefit more than risk of medications  to continue. Ok to rx early this week  Going out of town for a few days Counseled.  covid and  Vaccine taking precautions   Expectant management and discussion of plan and treatment with opportunity to ask questions and all were answered. The patient agreed with the plan and demonstrated an understanding of the instructions.   Advised to call back or seek an in-person evaluation if worsening  or having  further concerns . Return for preventive /cpx and medications 6 months   fasting labs  any time  orders in system.    Shanon Ace, MD

## 2019-04-15 NOTE — Telephone Encounter (Signed)
I requested early refill on the prescription pharmacy note . Call the pharmacy and have them fill early like requested and then find out  If they saw the message or not and how to notify them in the future  .

## 2019-06-09 MED ORDER — AZELASTINE HCL 0.05 % OP SOLN: 1.0000 [drp] | Freq: Two times a day (BID) | OPHTHALMIC | 3 refills | Status: DC

## 2019-06-17 IMAGING — DX DG OS CALCIS 2+V*R*
2 series · 2 of 2 positions shown · non-contrast
Comparison: None.

CLINICAL DATA: Right lateral ankle pain.

EXAM:
RIGHT OS CALCIS - 2+ VIEW; RIGHT FOOT COMPLETE - 3+ VIEW

[calcaneus lat]
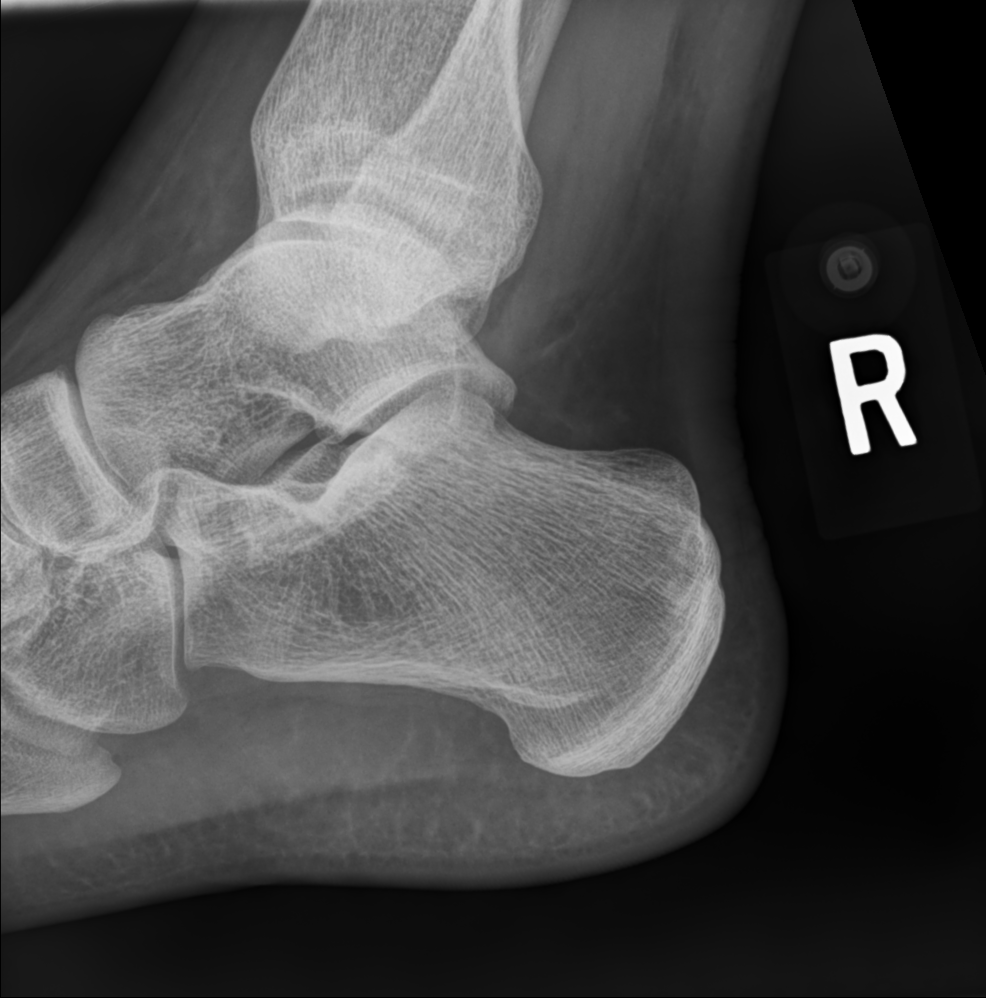

[calcaneus axial tan]
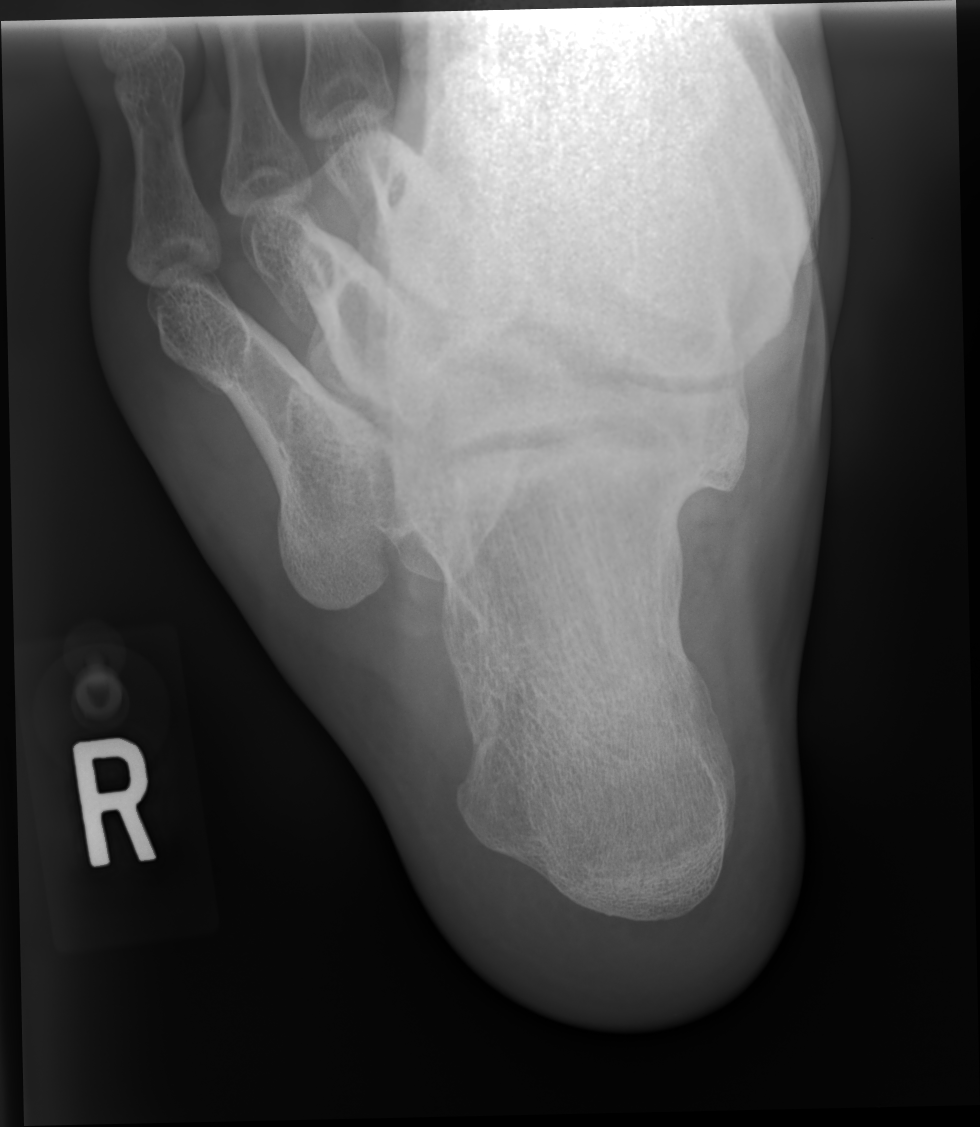

[2 of 2 positions shown; findings below may reference images not displayed]

FINDINGS: There is no evidence of fracture or other focal bone lesions. There
is no plantar calcaneal spur. There is no periosteal reaction or
bone destruction. Soft tissues are unremarkable.
IMPRESSION: 1.  No acute osseous injury of the right foot.
2.  No acute osseous injury of the calcaneus.

## 2019-07-07 ENCOUNTER — Other Ambulatory Visit: Payer: Self-pay

## 2019-07-07 MED ORDER — AMPHETAMINE-DEXTROAMPHET ER 25 MG PO CP24: 25.0000 mg | ORAL_CAPSULE | ORAL | 0 refills | Status: DC

## 2019-07-07 NOTE — Telephone Encounter (Signed)
Last OV Televisit 04/14/2019, NOV scheduled on 07/29/19 for CPE  Last filled 04/14/2019, # 30 with 2 refills

## 2019-07-07 NOTE — Telephone Encounter (Signed)
Sent in electronically .  

## 2019-07-08 NOTE — Telephone Encounter (Signed)
I doubt if  You can transfer a controlled substance rx to a [facility] but you should call them about this  Jonathan [name] can you call [facility] and say he can get med early if needed ?

## 2019-07-09 MED ORDER — AMPHETAMINE-DEXTROAMPHET ER 25 MG PO CP24: 25.0000 mg | ORAL_CAPSULE | ORAL | 0 refills | Status: DC

## 2019-07-09 NOTE — Telephone Encounter (Signed)
I sent in one refill to [facility] he requested .   (Seems silly to not be able to get the medication a day early)

## 2019-07-28 ENCOUNTER — Other Ambulatory Visit: Payer: Self-pay

## 2019-07-28 NOTE — Progress Notes (Signed)
This visit occurred during the SARS-CoV-2 public health emergency.  Safety protocols were in place, including screening questions prior to the visit, additional usage of staff PPE, and extensive cleaning of exam room while observing appropriate contact time as indicated for disinfecting solutions.    Chief Complaint  Patient presents with  . Annual Exam    Doing well    HPI: Patient  Jonathan [name]  38 y.o. comes in today for Preventive Health Care visit  And med check  adhd : In am   Every day  .  No se .  Ocass rare  not taking on weekend.  Got covid vaccine. Pfizer    Gf fiance  and 40 yo stressful.but better  To be married next April No change in heatlh No change FAM hx   Health Maintenance  Topic Date Due  . HIV Screening  Never done  . INFLUENZA VACCINE  10/26/2019  . TETANUS/TDAP  07/18/2020   Health Maintenance Review LIFESTYLE:  Exercise:  Run or walk   30 minutes Tobacco/ETS: no Alcohol:  7-10 per week  Sugar beverages: Sleep:  6-8 Drug use: no HH of  3... 1 dog  Work: FROM HOME  40  Per week so far     ROS:  GEN/ HEENT: No fever, significant weight changes sweats headaches vision problems hearing changes, CV/ PULM; No chest pain shortness of breath cough, syncope,edema  change in exercise tolerance. GI /GU: No adominal pain, vomiting, change in bowel habits. No blood in the stool. No significant GU symptoms. SKIN/HEME: ,no acute skin rashes suspicious lesions or bleeding. No lymphadenopathy, nodules, masses.  NEURO/ PSYCH:  No neurologic signs such as weakness numbness. No depression anxiety. IMM/ Allergy: No unusual infections.  Allergy .   REST of 12 system review negative except as per HPI   Past Medical History:  Diagnosis Date  . ADD (attention deficit disorder)   . Allergy   . Asthma    hx consult Dr Annamaria Boots.  . Blood in stool 11/13/2014   painless  ? not mixed in  and gi opinoin  r no chnag ein bowel habits    . IBS (irritable bowel syndrome)      Past Surgical History:  Procedure Laterality Date  . UPPER GASTROINTESTINAL ENDOSCOPY     eval by GI neg  . WRIST SURGERY     right- March 2016    Family History  Problem Relation Age of Onset  . Hyperlipidemia Father   . Colon cancer Neg Hx   . Esophageal cancer Neg Hx   . Rectal cancer Neg Hx   . Stomach cancer Neg Hx     Social History   Socioeconomic History  . Marital status: Single    Spouse name: Not on file  . Number of children: Not on file  . Years of education: Not on file  . Highest education level: Not on file  Occupational History  . Not on file  Tobacco Use  . Smoking status: Never Smoker  . Smokeless tobacco: Former Systems developer    Types: Chew  Substance and Sexual Activity  . Alcohol use: Yes    Alcohol/week: 0.0 standard drinks    Comment: socially  . Drug use: No  . Sexual activity: Not on file  Other Topics Concern  . 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       Regular exercise- yes runner 2 x per week  Lift 2 x per week    No ets.    workign from home during covid pandemic rstrictions   Social Determinants of Health   Financial Resource Strain:   . Difficulty of Paying Living Expenses:   Food Insecurity:   . Worried About Charity fundraiser in the Last Year:   . Arboriculturist in the Last Year:   Transportation Needs:   . Film/video editor (Medical):   Marland Kitchen Lack of Transportation (Non-Medical):   Physical Activity:   . Days of Exercise per Week:   . Minutes of Exercise per Session:   Stress:   . Feeling of Stress :   Social Connections:   . Frequency of Communication with Friends and Family:   . Frequency of Social Gatherings with Friends and Family:   . Attends Religious Services:   . Active Member of Clubs or Organizations:   . Attends Archivist Meetings:   Marland Kitchen Marital  Status:     Outpatient Medications Prior to Visit  Medication Sig Dispense Refill  . amphetamine-dextroamphetamine (ADDERALL XR) 25 MG 24 hr capsule Take 1 capsule by mouth every morning. Fill last 30 capsule 0  . amphetamine-dextroamphetamine (ADDERALL XR) 25 MG 24 hr capsule Take 1 capsule by mouth every morning. Fill second 30 capsule 0  . amphetamine-dextroamphetamine (ADDERALL XR) 25 MG 24 hr capsule Take 1 capsule by mouth every morning. 30 capsule 0  . azelastine (OPTIVAR) 0.05 % ophthalmic solution Place 1 drop into both eyes 2 (two) times daily. 6 mL 3  . gabapentin (NEURONTIN) 300 MG capsule Start with 1 tab po qhs X 1 week, then increase to 1 tab po bid X 1 week then 1 tab po tid prn (Patient not taking: Reported on 07/29/2019) 90 capsule 1  . Ibuprofen-Famotidine (DUEXIS) 800-26.6 MG TABS Take 1 tablet by mouth 3 (three) times daily as needed. 1 tab po tid X 14 days then 1 tab po tid as needed (Patient not taking: Reported on 07/11/2018) 90 tablet 2   No facility-administered medications prior to visit.     EXAM:  BP 132/74   Pulse 81   Temp 98 F (36.7 C) (Temporal)   Ht 5' 9.5" (1.765 m)   Wt 164 lb (74.4 kg)   SpO2 98%   BMI 23.87 kg/m   Body mass index is 23.87 kg/m. Wt Readings from Last 3 Encounters:  07/29/19 164 lb (74.4 kg)  05/30/18 171 lb 6.4 oz (77.7 kg)  05/02/18 172 lb 9.6 oz (78.3 kg)    Physical Exam: Vital signs reviewed [id number] is a well-developed well-nourished alert cooperative    who appearsr stated age in no acute distress.  HEENT: normocephalic atraumatic , Eyes: PERRL EOM's full, conjunctiva clear, Nares: paten,t no deformity discharge or tenderness., Ears: no deformity EAC's clear TMs with normal landmarks. Mouth: clear OP,masked NECK: supple without masses, thyromegaly or bruits. CHEST/PULM:  Clear to auscultation and percussion breath sounds equal no wheeze , rales or rhonchi. No chest wall deformities or tenderness. CV: PMI is  nondisplaced, S1 S2 no gallops, murmurs, rubs. Peripheral pulses are full without delay.No JVD .  ABDOMEN: Bowel sounds normal nontender  No guard or rebound, no hepato splenomegal no CVA tenderness.  Extremtities:  No clubbing cyanosis or edema, no acute joint swelling or redness  no focal atrophy NEURO:  Oriented x3, cranial nerves 3-12 appear to be intact, no obvious focal weakness,gait within normal limits no abnormal reflexes or asymmetrical SKIN: No acute rashes normal turgor, color, no bruising or petechiae. Moles no suspecious obvious PSYCH: Oriented, good eye contact, no obvious depression anxiety, cognition and judgment appear normal. LN: no cervical axillary inguinal adenopathy  Lab Results  Component Value Date   WBC 4.4 05/28/2017   HGB 15.4 05/28/2017   HCT 44.0 05/28/2017   PLT 247.0 05/28/2017   GLUCOSE 86 05/28/2017   CHOL 223 (H) 05/28/2017   TRIG 161.0 (H) 05/28/2017   HDL 55.30 05/28/2017   LDLCALC 135 (H) 05/28/2017   ALT 32 05/28/2017   AST 23 05/28/2017   NA 139 05/28/2017   K 4.2 05/28/2017   CL 101 05/28/2017   CREATININE 0.86 05/28/2017   BUN 13 05/28/2017   CO2 31 05/28/2017   TSH 2.27 05/28/2017   HGBA1C 5.2 05/28/2017    BP Readings from Last 3 Encounters:  07/29/19 132/74  05/30/18 126/68  05/02/18 138/86     ASSESSMENT AND PLAN:  Discussed the following assessment and plan:    ICD-10-CM   1. Routine general medical examination at a health care facility  123456 Basic metabolic panel    CBC with Differential/Platelet    Hepatic function panel    Lipid panel    TSH  2. Attention deficit disorder, unspecified hyperactivity presence  [id number] Basic metabolic panel    CBC with Differential/Platelet    Hepatic function panel    Lipid panel    TSH  3. Medication management  123456 Basic metabolic panel    CBC with Differential/Platelet    Hepatic function panel    Lipid panel    TSH   Return in about 6 months (around 01/29/2020) for med  check . Benefit more than risk of medication  to continue. Fasting lab today  Patient Care Team: Jilian West, Standley Brooking, MD as PCP - General Gerda Diss, DO as Consulting Physician (Sports Medicine) Patient Instructions   Continue lifestyle intervention healthy eating and exercise .  Will notify you  of labs when available.   Med check in 6 months  Health Maintenance, Male Adopting a healthy lifestyle and getting preventive care are important in promoting health and wellness. Ask your health care provider about:  The right schedule for you to have regular tests and exams.  Things you can do on your own to prevent diseases and keep yourself healthy. What should I know about diet, weight, and exercise? Eat a healthy diet   Eat a diet that includes plenty of vegetables, fruits, low-fat dairy products, and lean protein.  Do not eat a lot of foods that are high in solid fats, added sugars, or sodium. Maintain a healthy weight Body mass index (BMI) is a measurement that can be used to identify possible weight problems. It estimates body fat based on height and weight. Your health care provider can help determine your BMI and help you achieve or maintain a healthy weight. Get regular exercise Get regular exercise. This is one of the most important things you can do for your health. Most adults should:  Exercise for at least 150 minutes each week. The exercise should increase your heart rate and make you sweat (moderate-intensity exercise).  Do strengthening exercises at least twice a week. This is in addition to the moderate-intensity exercise.  Spend less time sitting. Even light physical activity can be beneficial.  Watch cholesterol and blood lipids Have your blood tested for lipids and cholesterol at 40 years of age, then have this test every 5 years. You may need to have your cholesterol levels checked more often if:  Your lipid or cholesterol levels are high.  You are older than  40 years of age.  You are at high risk for heart disease. What should I know about cancer screening? Many types of cancers can be detected early and may often be prevented. Depending on your health history and family history, you may need to have cancer screening at various ages. This may include screening for:  Colorectal cancer.  Prostate cancer.  Skin cancer.  Lung cancer. What should I know about heart disease, diabetes, and high blood pressure? Blood pressure and heart disease  High blood pressure causes heart disease and increases the risk of stroke. This is more likely to develop in people who have high blood pressure readings, are of African descent, or are overweight.  Talk with your health care provider about your target blood pressure readings.  Have your blood pressure checked: ? Every 3-5 years if you are 62-47 years of age. ? Every year if you are 52 years old or older.  If you are between the ages of 101 and 11 and are a current or former smoker, ask your health care provider if you should have a one-time screening for abdominal aortic aneurysm (AAA). Diabetes Have regular diabetes screenings. This checks your fasting blood sugar level. Have the screening done:  Once every three years after age 44 if you are at a normal weight and have a low risk for diabetes.  More often and at a younger age if you are overweight or have a high risk for diabetes. What should I know about preventing infection? Hepatitis B If you have a higher risk for hepatitis B, you should be screened for this virus. Talk with your health care provider to find out if you are at risk for hepatitis B infection. Hepatitis C Blood testing is recommended for:  Everyone born from [birth year] through 1966.  Anyone with known risk factors for hepatitis C. Sexually transmitted infections (STIs)  You should be screened each year for STIs, including gonorrhea and chlamydia, if: ? You are sexually active and  are younger than 40 years of age. ? You are older than 40 years of age and your health care provider tells you that you are at risk for this type of infection. ? Your sexual activity has changed since you were last screened, and you are at increased risk for chlamydia or gonorrhea. Ask your health care provider if you are at risk.  Ask your health care provider about whether you are at high risk for HIV. Your health care provider may recommend a prescription medicine to help prevent HIV infection. If you choose to take medicine to prevent HIV, you should first get tested for HIV. You should then be tested every 3 months for as long as you are taking the medicine. Follow these instructions at home: Lifestyle  Do not use any products that contain nicotine or tobacco, such as cigarettes, e-cigarettes, and chewing tobacco. If you need help quitting, ask your health care provider.  Do not use street drugs.  Do not share needles.  Ask your health care provider for help if you need support or information about quitting drugs. Alcohol use  Do not drink alcohol if your health care provider tells you not to drink.  If you drink alcohol: ? Limit how much you have to 0-2 drinks a day. ? Be aware of how much alcohol is in your drink. In the [place] one drink equals one 12 oz bottle of beer (355 mL), one 5 oz glass of wine (148 mL), or one 1 oz glass of hard liquor (44 mL). General instructions  Schedule regular health, dental, and eye exams.  Stay current with your vaccines.  Tell your health care provider if: ? You often feel depressed. ? You have ever been abused or do not feel safe at home. Summary  Adopting a healthy lifestyle and getting preventive care are important in promoting health and wellness.  Follow your health care provider's instructions about healthy diet, exercising, and getting tested or screened for diseases.  Follow your health care provider's instructions on monitoring  your cholesterol and blood pressure. This information is not intended to replace advice given to you by your health care provider. Make sure you discuss any questions you have with your health care provider. Document Revised: 03/06/2018 Document Reviewed: 03/06/2018 Elsevier Patient Education  2020 Bowen Yides Saidi M.D.

## 2019-07-29 ENCOUNTER — Other Ambulatory Visit: Payer: Self-pay

## 2019-07-29 ENCOUNTER — Ambulatory Visit (INDEPENDENT_AMBULATORY_CARE_PROVIDER_SITE_OTHER): Payer: BC Managed Care – PPO | Admitting: Internal Medicine

## 2019-07-29 ENCOUNTER — Encounter: Payer: Self-pay | Admitting: Internal Medicine

## 2019-07-29 VITALS — BP 132/74 | HR 81 | Temp 98.0°F | Ht 69.5 in | Wt 164.0 lb

## 2019-07-29 DIAGNOSIS — Z79899 Other long term (current) drug therapy: Secondary | ICD-10-CM

## 2019-07-29 DIAGNOSIS — F988 Other specified behavioral and emotional disorders with onset usually occurring in childhood and adolescence: Secondary | ICD-10-CM | POA: Diagnosis not present

## 2019-07-29 DIAGNOSIS — Z0000 Encounter for general adult medical examination without abnormal findings: Secondary | ICD-10-CM | POA: Diagnosis not present

## 2019-07-29 LAB — CBC WITH DIFFERENTIAL/PLATELET
Basophils Absolute: 0 10*3/uL (ref 0.0–0.1)
Basophils Relative: 0.5 % (ref 0.0–3.0)
Eosinophils Absolute: 0.1 10*3/uL (ref 0.0–0.7)
Eosinophils Relative: 2.2 % (ref 0.0–5.0)
HCT: 42.5 % (ref 39.0–52.0)
Hemoglobin: 14.8 g/dL (ref 13.0–17.0)
Lymphocytes Relative: 32.1 % (ref 12.0–46.0)
Lymphs Abs: 1.3 10*3/uL (ref 0.7–4.0)
MCHC: 34.7 g/dL (ref 30.0–36.0)
MCV: 94.5 fl (ref 78.0–100.0)
Monocytes Absolute: 0.4 10*3/uL (ref 0.1–1.0)
Monocytes Relative: 11 % (ref 3.0–12.0)
Neutro Abs: 2.2 10*3/uL (ref 1.4–7.7)
Neutrophils Relative %: 54.2 % (ref 43.0–77.0)
Platelets: 205 10*3/uL (ref 150.0–400.0)
RBC: 4.5 Mil/uL (ref 4.22–5.81)
RDW: 12.7 % (ref 11.5–15.5)
WBC: 4 10*3/uL (ref 4.0–10.5)

## 2019-07-29 LAB — HEPATIC FUNCTION PANEL
ALT: 31 U/L (ref 0–53)
AST: 31 U/L (ref 0–37)
Albumin: 4.4 g/dL (ref 3.5–5.2)
Alkaline Phosphatase: 84 U/L (ref 39–117)
Bilirubin, Direct: 0.3 mg/dL (ref 0.0–0.3)
Total Bilirubin: 1 mg/dL (ref 0.2–1.2)
Total Protein: 7 g/dL (ref 6.0–8.3)

## 2019-07-29 LAB — LIPID PANEL
Cholesterol: 220 mg/dL — ABNORMAL HIGH (ref 0–200)
HDL: 63.6 mg/dL (ref >39.00)
LDL Cholesterol: 134 mg/dL — ABNORMAL HIGH (ref 0–99)
NonHDL: 156.68
Total CHOL/HDL Ratio: 3
Triglycerides: 115 mg/dL (ref 0.0–149.0)
VLDL: 23 mg/dL (ref 0.0–40.0)

## 2019-07-29 LAB — BASIC METABOLIC PANEL
BUN: 14 mg/dL (ref 6–23)
CO2: 31 mEq/L (ref 19–32)
Calcium: 9.6 mg/dL (ref 8.4–10.5)
Chloride: 99 mEq/L (ref 96–112)
Creatinine, Ser: 0.98 mg/dL (ref 0.40–1.50)
GFR: 84.75 mL/min (ref >60.00)
Glucose, Bld: 98 mg/dL (ref 70–99)
Potassium: 3.9 mEq/L (ref 3.5–5.1)
Sodium: 137 mEq/L (ref 135–145)

## 2019-07-29 LAB — TSH: TSH: 2.77 u[IU]/mL (ref 0.35–4.50)

## 2019-07-29 NOTE — Progress Notes (Signed)
Results are normal except cholesterol   but favorable ratio  Continue lifestyle intervention healthy eating and exercise .

## 2019-07-29 NOTE — Patient Instructions (Signed)
Continue lifestyle intervention healthy eating and exercise .  Will notify you  of labs when available.   Med check in 6 months  Health Maintenance, Male Adopting a healthy lifestyle and getting preventive care are important in promoting health and wellness. Ask your health care provider about:  The right schedule for you to have regular tests and exams.  Things you can do on your own to prevent diseases and keep yourself healthy. What should I know about diet, weight, and exercise? Eat a healthy diet   Eat a diet that includes plenty of vegetables, fruits, low-fat dairy products, and lean protein.  Do not eat a lot of foods that are high in solid fats, added sugars, or sodium. Maintain a healthy weight Body mass index (BMI) is a measurement that can be used to identify possible weight problems. It estimates body fat based on height and weight. Your health care provider can help determine your BMI and help you achieve or maintain a healthy weight. Get regular exercise Get regular exercise. This is one of the most important things you can do for your health. Most adults should:  Exercise for at least 150 minutes each week. The exercise should increase your heart rate and make you sweat (moderate-intensity exercise).  Do strengthening exercises at least twice a week. This is in addition to the moderate-intensity exercise.  Spend less time sitting. Even light physical activity can be beneficial. Watch cholesterol and blood lipids Have your blood tested for lipids and cholesterol at 40 years of age, then have this test every 5 years. You may need to have your cholesterol levels checked more often if:  Your lipid or cholesterol levels are high.  You are older than 40 years of age.  You are at high risk for heart disease. What should I know about cancer screening? Many types of cancers can be detected early and may often be prevented. Depending on your health history and family  history, you may need to have cancer screening at various ages. This may include screening for:  Colorectal cancer.  Prostate cancer.  Skin cancer.  Lung cancer. What should I know about heart disease, diabetes, and high blood pressure? Blood pressure and heart disease  High blood pressure causes heart disease and increases the risk of stroke. This is more likely to develop in people who have high blood pressure readings, are of African descent, or are overweight.  Talk with your health care provider about your target blood pressure readings.  Have your blood pressure checked: ? Every 3-5 years if you are 46-21 years of age. ? Every year if you are 7 years old or older.  If you are between the ages of 44 and 60 and are a current or former smoker, ask your health care provider if you should have a one-time screening for abdominal aortic aneurysm (AAA). Diabetes Have regular diabetes screenings. This checks your fasting blood sugar level. Have the screening done:  Once every three years after age 33 if you are at a normal weight and have a low risk for diabetes.  More often and at a younger age if you are overweight or have a high risk for diabetes. What should I know about preventing infection? Hepatitis B If you have a higher risk for hepatitis B, you should be screened for this virus. Talk with your health care provider to find out if you are at risk for hepatitis B infection. Hepatitis C Blood testing is recommended for:  Everyone  born from [birth year] through 1966.  Anyone with known risk factors for hepatitis C. Sexually transmitted infections (STIs)  You should be screened each year for STIs, including gonorrhea and chlamydia, if: ? You are sexually active and are younger than 39 years of age. ? You are older than 40 years of age and your health care provider tells you that you are at risk for this type of infection. ? Your sexual activity has changed since you were last  screened, and you are at increased risk for chlamydia or gonorrhea. Ask your health care provider if you are at risk.  Ask your health care provider about whether you are at high risk for HIV. Your health care provider may recommend a prescription medicine to help prevent HIV infection. If you choose to take medicine to prevent HIV, you should first get tested for HIV. You should then be tested every 3 months for as long as you are taking the medicine. Follow these instructions at home: Lifestyle  Do not use any products that contain nicotine or tobacco, such as cigarettes, e-cigarettes, and chewing tobacco. If you need help quitting, ask your health care provider.  Do not use street drugs.  Do not share needles.  Ask your health care provider for help if you need support or information about quitting drugs. Alcohol use  Do not drink alcohol if your health care provider tells you not to drink.  If you drink alcohol: ? Limit how much you have to 0-2 drinks a day. ? Be aware of how much alcohol is in your drink. In the [place] one drink equals one 12 oz bottle of beer (355 mL), one 5 oz glass of wine (148 mL), or one 1 oz glass of hard liquor (44 mL). General instructions  Schedule regular health, dental, and eye exams.  Stay current with your vaccines.  Tell your health care provider if: ? You often feel depressed. ? You have ever been abused or do not feel safe at home. Summary  Adopting a healthy lifestyle and getting preventive care are important in promoting health and wellness.  Follow your health care provider's instructions about healthy diet, exercising, and getting tested or screened for diseases.  Follow your health care provider's instructions on monitoring your cholesterol and blood pressure. This information is not intended to replace advice given to you by your health care provider. Make sure you discuss any questions you have with your health care provider. Document  Revised: 03/06/2018 Document Reviewed: 03/06/2018 Elsevier Patient Education  2020 [employer].

## 2019-08-11 ENCOUNTER — Telehealth: Payer: Self-pay | Admitting: Internal Medicine

## 2019-08-11 NOTE — Telephone Encounter (Signed)
amphetamine-dextroamphetamine (ADDERALL XR) 25 MG 24 hr capsule  [facility] [id number] - [place], [place] - [address] Phone:  [phone]  Fax:  [phone]     He is completely out of his Rx

## 2019-08-11 NOTE — Telephone Encounter (Signed)
Called patient and he stated that his normal pharmacy is out of stock of the Adderall and he needs a refill sent to the [facility] that is listed in first part of note.

## 2019-08-12 MED ORDER — AMPHETAMINE-DEXTROAMPHET ER 25 MG PO CP24: 25.0000 mg | ORAL_CAPSULE | ORAL | 0 refills | Status: DC

## 2019-08-12 NOTE — Telephone Encounter (Signed)
1 refill sent as requested to [facility]

## 2019-08-12 NOTE — Addendum Note (Signed)
Addended byShanon Ace K on: 08/12/2019 12:39 PM   Modules accepted: Orders

## 2019-11-10 ENCOUNTER — Telehealth: Payer: Self-pay | Admitting: Internal Medicine

## 2019-11-10 NOTE — Telephone Encounter (Signed)
Please see message.   Last OV 07/29/2019  Last filled 07/07/2019, #30 with 1 refill  Also on 08/12/2019, # 30 with 0 refills

## 2019-11-10 NOTE — Telephone Encounter (Signed)
The patient is needing a refill on this medication. The [facility] that he uses is on [address] and they are out of this medication. Can you send this Rx to the [facility] on [address] if they have the medication in stock?   amphetamine-dextroamphetamine (ADDERALL XR) 25 MG 24 hr capsule   [facility] #[id number] - [place], [place] - [address] AT [address] Phone:  [phone]  Fax:  [phone]

## 2019-11-11 MED ORDER — AMPHETAMINE-DEXTROAMPHET ER 25 MG PO CP24: 25.0000 mg | ORAL_CAPSULE | ORAL | 0 refills | Status: DC

## 2019-11-11 NOTE — Telephone Encounter (Signed)
Sent refill to [facility]  Please ensure that this is correct .

## 2019-11-11 NOTE — Addendum Note (Signed)
Addended byBurnis Medin on: 11/11/2019 05:34 PM   Modules accepted: Orders

## 2019-11-12 NOTE — Telephone Encounter (Signed)
Called patient and he stated that he was able to pick up his medication. Patient stated that he will check to see if they have in stock for his next refill and let us know. Patient verbalized an understanding.

## 2019-12-27 ENCOUNTER — Emergency Department (HOSPITAL_BASED_OUTPATIENT_CLINIC_OR_DEPARTMENT_OTHER)
Admission: EM | Admit: 2019-12-27 | Discharge: 2019-12-27 | Disposition: A | Discharge status: Home / Self Care | Payer: BC Managed Care – PPO | Attending: Emergency Medicine | Admitting: Emergency Medicine

## 2019-12-27 ENCOUNTER — Other Ambulatory Visit: Payer: Self-pay

## 2019-12-27 ENCOUNTER — Encounter (HOSPITAL_BASED_OUTPATIENT_CLINIC_OR_DEPARTMENT_OTHER): Payer: Self-pay | Admitting: Emergency Medicine

## 2019-12-27 DIAGNOSIS — S61121A Laceration with foreign body of right thumb with damage to nail, initial encounter: Secondary | ICD-10-CM | POA: Diagnosis not present

## 2019-12-27 DIAGNOSIS — W268XXA Contact with other sharp object(s), not elsewhere classified, initial encounter: Secondary | ICD-10-CM | POA: Insufficient documentation

## 2019-12-27 DIAGNOSIS — Y92090 Kitchen in other non-institutional residence as the place of occurrence of the external cause: Secondary | ICD-10-CM | POA: Insufficient documentation

## 2019-12-27 DIAGNOSIS — Z23 Encounter for immunization: Secondary | ICD-10-CM | POA: Diagnosis not present

## 2019-12-27 DIAGNOSIS — J45909 Unspecified asthma, uncomplicated: Secondary | ICD-10-CM | POA: Insufficient documentation

## 2019-12-27 DIAGNOSIS — S60931A Unspecified superficial injury of right thumb, initial encounter: Secondary | ICD-10-CM | POA: Diagnosis not present

## 2019-12-27 MED ORDER — LIDOCAINE-EPINEPHRINE 1 %-1:100000 IJ SOLN: 20.0000 mL | Freq: Once | INTRAMUSCULAR | Status: AC

## 2019-12-27 MED ORDER — TETANUS-DIPHTH-ACELL PERTUSSIS 5-2.5-18.5 LF-MCG/0.5 IM SUSP
0.5000 mL | Freq: Once | INTRAMUSCULAR | Status: AC
  Administered 2019-12-27: 0.5 mL via INTRAMUSCULAR
  Filled 2019-12-27: qty 0.5

## 2019-12-27 MED ORDER — LIDOCAINE-EPINEPHRINE 2 %-1:100000 IJ SOLN: 5.0000 mL | Freq: Once | INTRAMUSCULAR | Status: DC

## 2019-12-27 MED ORDER — LIDOCAINE-EPINEPHRINE 1 %-1:100000 IJ SOLN
INTRAMUSCULAR | Status: AC
  Administered 2019-12-27: 20 mL via INTRADERMAL
  Filled 2019-12-27: qty 1

## 2019-12-27 NOTE — Discharge Instructions (Addendum)
Keep your thumb dry for 48 hours.  Avoid wetting the area.  The clotting dust will darken and eventually form a scab.  You can take down your dressing in 2 days and leave the wound open to air at that point.  You should have this wound re-examined in 48-72 hours to ensure it is healing and there are no signs of infection setting in.  You can schedule an appointment with your PCP or an urgent care or come back to the ED.

## 2019-12-27 NOTE — ED Notes (Signed)
rec laceration to rt thumb, active bleeding noted, guaze and pressure applied to area.

## 2019-12-27 NOTE — ED Triage Notes (Addendum)
Reports laceration from kitchen knife at 1900 - no skin flap, cut skin cleanly off.. Pressure dressing applied in triage. Continuing to bleed. Needs tdap updated.

## 2019-12-27 NOTE — ED Notes (Signed)
AVS reviewed with pt, dsg applied to rt thumb laceration per EDP orders, additional dsg supplies given to pt as well. Opportunity for questions provided.

## 2019-12-27 NOTE — ED Provider Notes (Signed)
[facility] EMERGENCY DEPARTMENT Provider Note   CSN: [id number] Arrival date & time: 12/27/19  2208     History Chief Complaint  Patient presents with  . Laceration    Jonathan [name] is a 39 y.o. male present emergency department with right thumb injury.  He reports he accidentally cut the skin off of the medial aspect of his right thumb using a greater in the kitchen today.  He applied dressing.  He continues to bleed.  He is not on blood thinners.  He does not recall his last tetanus.  He is right-handed.  HPI     Past Medical History:  Diagnosis Date  . ADD (attention deficit disorder)   . Allergy   . Asthma    hx consult Dr Annamaria Boots.  . Blood in stool 11/13/2014   painless  ? not mixed in  and gi opinoin  r no chnag ein bowel habits    . IBS (irritable bowel syndrome)     Patient Active Problem List   Diagnosis Date Noted  . Peroneal tendinitis of left lower extremity 04/10/2018  . Allergic conjunctivitis and rhinitis 07/08/2014  . Visit for preventive health examination 06/05/2014  . Right wrist pain 12/05/2013  . Medication management 11/18/2012  . Dysplastic nevus 01/02/2011  . Atypical moles 12/21/2010  . Routine general medical examination at a health care facility 07/19/2010  . ASTHMA 09/22/2007  . ALLERGIC RHINITIS 09/16/2007  . Attention deficit disorder 11/27/2006  . IRRITABLE BOWEL SYNDROME 11/27/2006    Past Surgical History:  Procedure Laterality Date  . UPPER GASTROINTESTINAL ENDOSCOPY     eval by GI neg  . WRIST SURGERY     right- March 2016       Family History  Problem Relation Age of Onset  . Hyperlipidemia Father   . Colon cancer Neg Hx   . Esophageal cancer Neg Hx   . Rectal cancer Neg Hx   . Stomach cancer Neg Hx     Social History   Tobacco Use  . Smoking status: Never Smoker  . Smokeless tobacco: Former Systems developer    Types: Secondary school teacher  . Vaping Use: Never used  Substance Use Topics  . Alcohol use: Yes     Alcohol/week: 0.0 standard drinks    Comment: socially  . Drug use: No    Home Medications Prior to Admission medications   Medication Sig Start Date End Date Taking? Authorizing Provider  amphetamine-dextroamphetamine (ADDERALL XR) 25 MG 24 hr capsule Take 1 capsule by mouth every morning. Fill second 07/07/19   Panosh, Standley Brooking, MD  amphetamine-dextroamphetamine (ADDERALL XR) 25 MG 24 hr capsule Take 1 capsule by mouth every morning. 08/12/19   Panosh, Standley Brooking, MD  amphetamine-dextroamphetamine (ADDERALL XR) 25 MG 24 hr capsule Take 1 capsule by mouth every morning. Fill last 11/11/19   Panosh, Standley Brooking, MD  azelastine (OPTIVAR) 0.05 % ophthalmic solution Place 1 drop into both eyes 2 (two) times daily. 06/09/19   Panosh, Standley Brooking, MD    Allergies    Patient has no known allergies.  Review of Systems   Review of Systems  Constitutional: Negative for chills and fever.  Cardiovascular: Negative for chest pain and palpitations.  Musculoskeletal: Negative for arthralgias and back pain.  Skin: Positive for rash and wound.  Neurological: Negative for weakness and numbness.  Psychiatric/Behavioral: Negative for agitation and confusion.  All other systems reviewed and are negative.   Physical Exam Updated Vital Signs BP Marland Kitchen)  154/91   Pulse 81   Temp 98 F (36.7 C)   Resp 16   SpO2 96%   Physical Exam Vitals and nursing note reviewed.  Constitutional:      Appearance: He is well-developed.  HENT:     Head: Normocephalic and atraumatic.  Eyes:     Conjunctiva/sclera: Conjunctivae normal.  Cardiovascular:     Rate and Rhythm: Normal rate and regular rhythm.     Pulses: Normal pulses.  Pulmonary:     Effort: Pulmonary effort is normal. No respiratory distress.     Breath sounds: Normal breath sounds.  Musculoskeletal:     Cervical back: Neck supple.  Skin:    General: Skin is warm and dry.     Comments: Filet-type laceration to medial aspect of right thumb, no bone exposed, small  continuous bleeding from middle aspect of this injury  Neurological:     Mental Status: He is alert.  Psychiatric:        Mood and Affect: Mood normal.        Behavior: Behavior normal.     ED Results / Procedures / Treatments   Labs (all labs ordered are listed, but only abnormal results are displayed) Labs Reviewed - No data to display  EKG None  Radiology No results found.  Procedures Procedures (including critical care time)  Medications Ordered in ED Medications  Tdap (BOOSTRIX) injection 0.5 mL (0.5 mLs Intramuscular Given 12/27/19 2240)  lidocaine-EPINEPHrine (XYLOCAINE W/EPI) 1 %-1:100000 (with pres) injection 20 mL (20 mLs Intradermal Given by Other 12/27/19 2301)    ED Course  I have reviewed the triage vital signs and the nursing notes.  Pertinent labs & imaging results that were available during my care of the patient were reviewed by me and considered in my medical decision making (see chart for details).  39 year old male presented emerge department for light type injury to the medial aspect of his right thumb.  There is no evidence of bony involvement or exposure.  The wound is oozing.  We attempted to tamponade with direct pressure unsuccessful.  I then applied a quick clotting agent as well as combat gauze.  After period of 15 minutes, the wound appears to have achieved hemostasis.  We do not need to place sutures at this time.  I observed the patient for an additional 20 minutes, there was no further bleeding.  I dressed the wound and discharged him home with instructions to have a wound recheck in 48 hours.  His tetanus was updated in the emergency department.  We provided him bandages for home.  I also provide him an additional quick clot agent to take with him at home in case he begins rebleeding.  Clinical Course as of Dec 27 2323  Sat Dec 27, 2019  2320 Quik clot agent appears to have stopped the bleeding now for 20 minutes of observation.  Will dress  wound, d/c home, advised recheck in 48 hours.   [MT]    Clinical Course User Index [MT] Jericha Bryden, Carola Rhine, MD    Final Clinical Impression(s) / ED Diagnoses Final diagnoses:  Laceration of right thumb with foreign body and damage to nail, initial encounter    Rx / DC Orders ED Discharge Orders    None       Nakaila Freeze, Carola Rhine, MD 12/27/19 2325

## 2019-12-29 IMAGING — DX DG OS CALCIS 2+V*L*
2 series · 2 of 2 positions shown · non-contrast
Comparison: None.

CLINICAL DATA: 37-year-old male with left heel pain for 2 months.
No reported trauma. Initial encounter.

EXAM:
LEFT OS CALCIS - 2+ VIEW

[calcaneus lat]
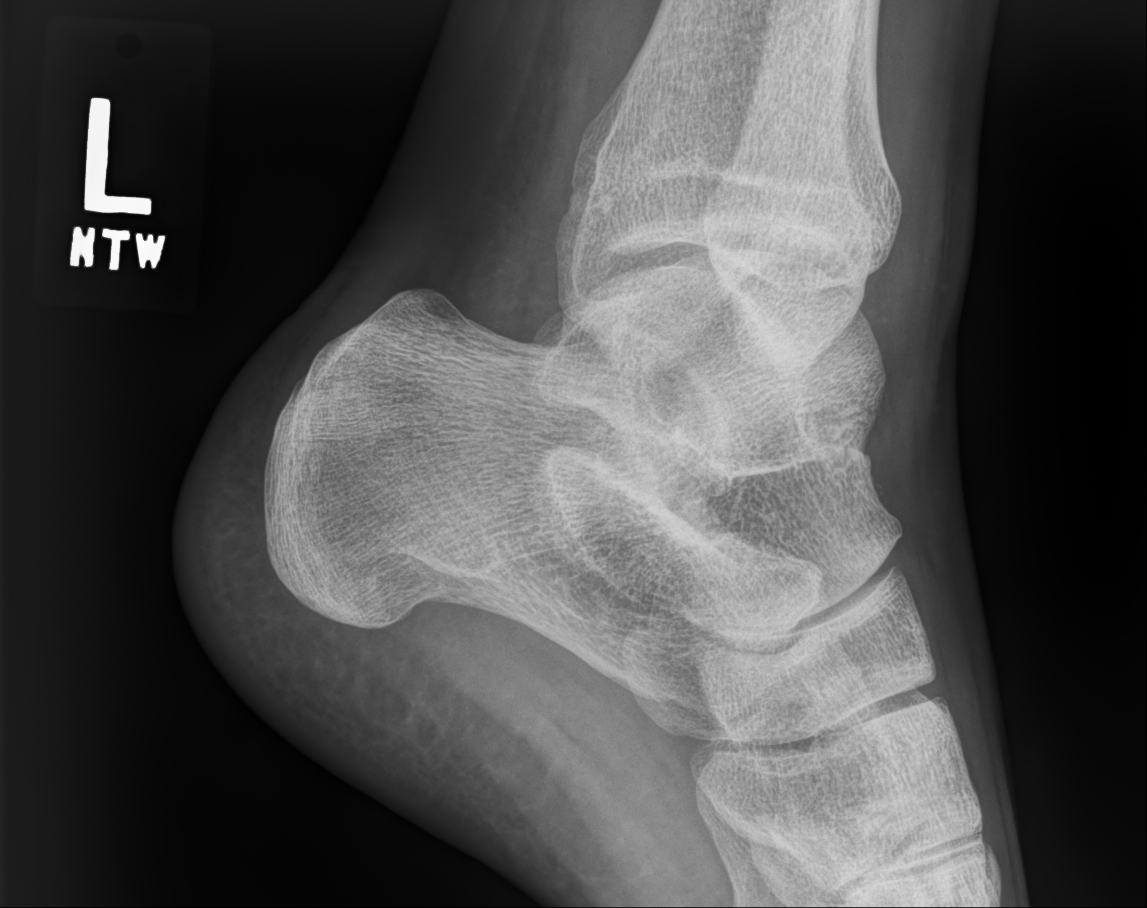

[calcaneus axial supine]
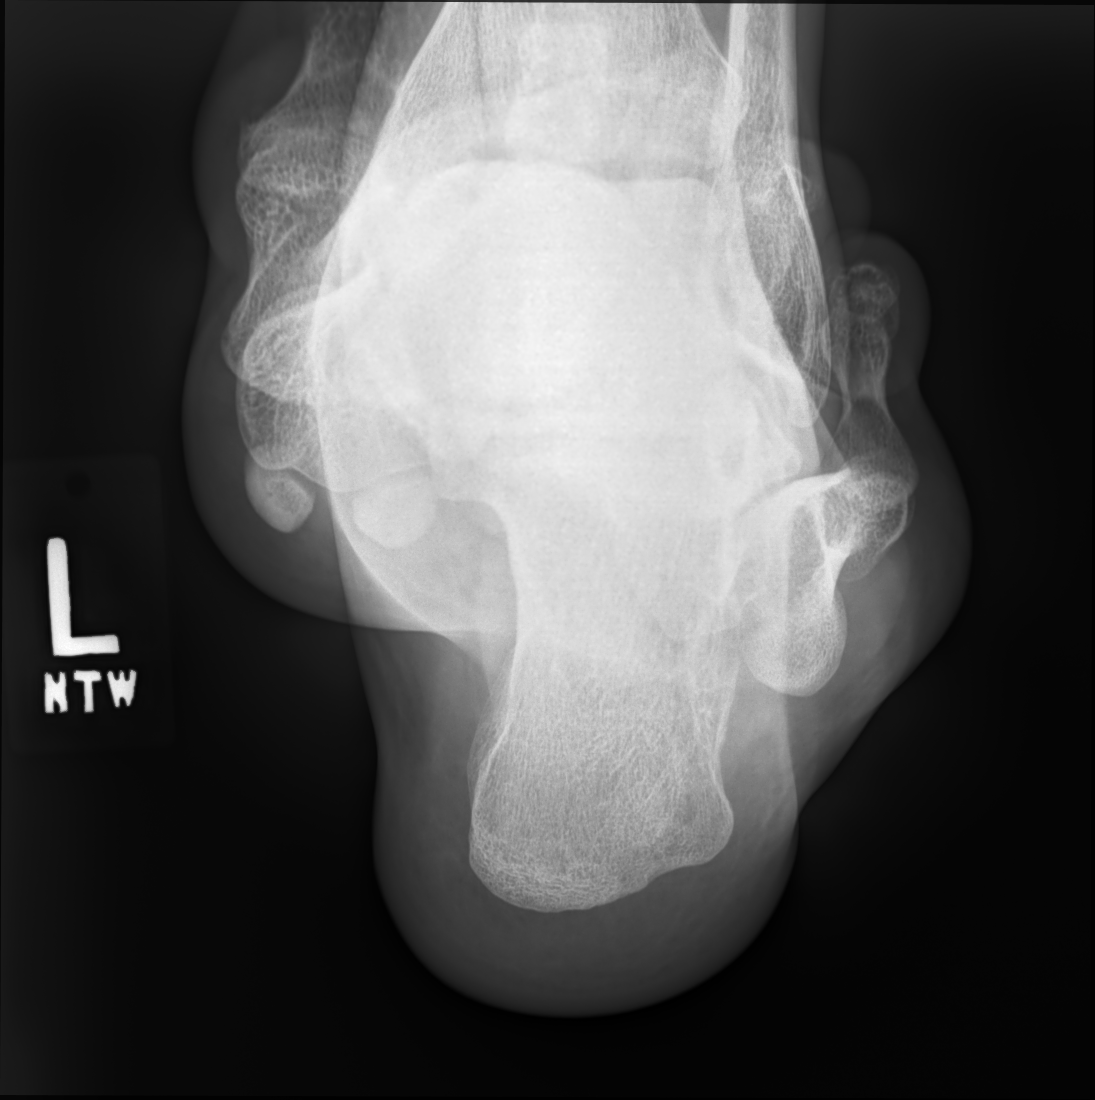

[2 of 2 positions shown; findings below may reference images not displayed]

FINDINGS: No plantar spur noted. This does not exclude possibility of plantar
fasciitis. If this were of high clinical concern and further
delineation were clinically desired, MR Tyrek then be considered. No
fracture or dislocation. No abnormal soft tissue calcifications.
IMPRESSION: Negative plain film exam of the left calcaneus.

## 2020-01-06 ENCOUNTER — Other Ambulatory Visit: Payer: Self-pay

## 2020-01-06 MED ORDER — AMPHETAMINE-DEXTROAMPHET ER 25 MG PO CP24
25.0000 mg | ORAL_CAPSULE | ORAL | 0 refills | Status: DC
Start: 2020-01-06 — End: 2020-03-03

## 2020-01-06 MED ORDER — AMPHETAMINE-DEXTROAMPHET ER 25 MG PO CP24
25.0000 mg | ORAL_CAPSULE | ORAL | 0 refills | Status: DC
Start: 2020-01-06 — End: 2020-03-16

## 2020-01-06 NOTE — Telephone Encounter (Signed)
Sent in electronically . ° 30 x 3  °

## 2020-01-06 NOTE — Telephone Encounter (Signed)
Per the controlled database: Adderall was last refilled on 12/11/2019.   Last office visit with PCP was on 07/29/2019.  No upcoming appointments with PCP scheduled at this time.   Please advise on rx refill.

## 2020-02-10 ENCOUNTER — Ambulatory Visit: Payer: BC Managed Care – PPO

## 2020-03-03 ENCOUNTER — Other Ambulatory Visit: Payer: Self-pay

## 2020-03-08 NOTE — Telephone Encounter (Signed)
Rx last filled 03/05/20, refills are for January.

## 2020-03-16 MED ORDER — AMPHETAMINE-DEXTROAMPHET ER 25 MG PO CP24
25.0000 mg | ORAL_CAPSULE | ORAL | 0 refills | Status: DC
Start: 2020-03-16 — End: 2020-06-28

## 2020-03-16 MED ORDER — AMPHETAMINE-DEXTROAMPHET ER 25 MG PO CP24
25.0000 mg | ORAL_CAPSULE | ORAL | 0 refills | Status: DC
Start: 2020-03-16 — End: 2020-06-25

## 2020-03-16 NOTE — Telephone Encounter (Signed)
Sent in electronically .  

## 2020-03-22 ENCOUNTER — Ambulatory Visit: Payer: BC Managed Care – PPO | Admitting: Internal Medicine

## 2020-03-22 ENCOUNTER — Encounter: Payer: Self-pay | Admitting: Internal Medicine

## 2020-03-22 ENCOUNTER — Other Ambulatory Visit: Payer: Self-pay

## 2020-03-22 VITALS — BP 128/80 | HR 93 | Temp 97.8°F | Ht 69.5 in | Wt 167.6 lb

## 2020-03-22 DIAGNOSIS — Z79899 Other long term (current) drug therapy: Secondary | ICD-10-CM | POA: Diagnosis not present

## 2020-03-22 DIAGNOSIS — F988 Other specified behavioral and emotional disorders with onset usually occurring in childhood and adolescence: Secondary | ICD-10-CM

## 2020-03-22 NOTE — Patient Instructions (Signed)
Glad you are doing well. Continue lifestyle intervention healthy eating and exercise .   Same medicine .  Plan ROV   Or  cpx in 6 months

## 2020-03-22 NOTE — Progress Notes (Signed)
Chief Complaint  Patient presents with  . med check     6 month follow for medication , pt has no other concerns     HPI: Jonathan [name] 39 y.o. come in for  med management  Still taking his XR Adderall with good results on a daily basis.  No significant side effects tolerating well. Social  Ok to be married in April social alcohol Exercise  Trained  half marathon.   No injury. Sleep  6-8  Work  40 + per week.  fam hx  No change  Parents in 25s and well Has had influenza vaccine and Covid vaccine. ROS: See pertinent positives and negatives per HPI.  No major changes of concern.  Past Medical History:  Diagnosis Date  . ADD (attention deficit disorder)   . Allergy   . Asthma    hx consult Dr Annamaria Boots.  . Blood in stool 11/13/2014   painless  ? not mixed in  and gi opinoin  r no chnag ein bowel habits    . IBS (irritable bowel syndrome)     Family History  Problem Relation Age of Onset  . Hyperlipidemia Father   . Colon cancer Neg Hx   . Esophageal cancer Neg Hx   . Rectal cancer Neg Hx   . Stomach cancer Neg Hx     Social History   Socioeconomic History  . Marital status: Single    Spouse name: Not on file  . Number of children: Not on file  . Years of education: Not on file  . Highest education level: Not on file  Occupational History  . Not on file  Tobacco Use  . Smoking status: Never Smoker  . Smokeless tobacco: Former Systems developer    Types: Secondary school teacher  . Vaping Use: Never used  Substance and Sexual Activity  . Alcohol use: Yes    Alcohol/week: 0.0 standard drinks    Comment: socially  . Drug use: No  . Sexual activity: Not on file  Other Topics Concern  . 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       Regular exercise- yes runner 2 x per week  Lift 2 x per week    No ets.    workign from home during covid  pandemic rstrictions   Social Determinants of Health   Financial Resource Strain: Not on file  Food Insecurity: Not on file  Transportation Needs: Not on file  Physical Activity: Not on file  Stress: Not on file  Social Connections: Not on file    Outpatient Medications Prior to Visit  Medication Sig Dispense Refill  . amphetamine-dextroamphetamine (ADDERALL XR) 25 MG 24 hr capsule Take 1 capsule by mouth every morning. Fill last 30 capsule 0  . amphetamine-dextroamphetamine (ADDERALL XR) 25 MG 24 hr capsule Take 1 capsule by mouth every morning. Fill second (Patient not taking: Reported on 03/22/2020) 30 capsule 0  . amphetamine-dextroamphetamine (ADDERALL XR) 25 MG 24 hr capsule Take 1 capsule by mouth every morning. (Patient not taking: Reported on 03/22/2020) 30 capsule 0  . azelastine (OPTIVAR) 0.05 % ophthalmic solution Place 1 drop into both eyes 2 (two) times daily. (Patient not taking: Reported on 03/22/2020) 6 mL 3   No facility-administered medications prior to  visit.     EXAM:  BP 128/80 (BP Location: Right Arm, Patient Position: Sitting, Cuff Size: Normal)   Pulse 93   Temp 97.8 F (36.6 C) (Oral)   Ht 5' 9.5" (1.765 m)   Wt 167 lb 9.6 oz (76 kg)   SpO2 97%   BMI 24.40 kg/m   Body mass index is 24.4 kg/m.  GENERAL: vitals reviewed and listed above, alert, oriented, appears well hydrated and in no acute distress HEENT: atraumatic, conjunctiva  clear, no obvious abnormalities on inspection of external nose and ears OP : Masked NECK: no obvious masses on inspection palpation  LUNGS: clear to auscultation bilaterally, no wheezes, rales or rhonchi, good air movement Abdomen soft without again a megaly guarding or rebound CV: HRRR, no clubbing cyanosis or  peripheral edema nl cap refill  MS: moves all extremities without noticeable focal  abnormality PSYCH: pleasant and cooperative, no obvious depression or anxiety Lab Results  Component Value Date   WBC 4.0  07/29/2019   HGB 14.8 07/29/2019   HCT 42.5 07/29/2019   PLT 205.0 07/29/2019   GLUCOSE 98 07/29/2019   CHOL 220 (H) 07/29/2019   TRIG 115.0 07/29/2019   HDL 63.60 07/29/2019   LDLCALC 134 (H) 07/29/2019   ALT 31 07/29/2019   AST 31 07/29/2019   NA 137 07/29/2019   K 3.9 07/29/2019   CL 99 07/29/2019   CREATININE 0.98 07/29/2019   BUN 14 07/29/2019   CO2 31 07/29/2019   TSH 2.77 07/29/2019   HGBA1C 5.2 05/28/2017   BP Readings from Last 3 Encounters:  03/22/20 128/80  12/27/19 (!) 154/91  07/29/19 132/74    ASSESSMENT AND PLAN:  Discussed the following assessment and plan:  Attention deficit disorder, unspecified hyperactivity presence  Medication management  -Patient advised to return or notify health care team  if  new concerns arise.  Patient Instructions  Glad you are doing well. Continue lifestyle intervention healthy eating and exercise .   Same medicine .  Plan ROV   Or  cpx in 6 months   Standley Brooking. Taheerah Guldin M.D.

## 2020-06-25 ENCOUNTER — Other Ambulatory Visit: Payer: Self-pay

## 2020-06-25 NOTE — Telephone Encounter (Signed)
Last office visit- 03/22/2020 Last refill-12/21-21  Next office visit- 09/22/2020

## 2020-06-28 MED ORDER — AMPHETAMINE-DEXTROAMPHET ER 25 MG PO CP24
25.0000 mg | ORAL_CAPSULE | ORAL | 0 refills | Status: DC
Start: 2020-06-28 — End: 2020-12-19

## 2020-06-28 MED ORDER — AMPHETAMINE-DEXTROAMPHET ER 25 MG PO CP24
25.0000 mg | ORAL_CAPSULE | ORAL | 0 refills | Status: DC
Start: 2020-06-28 — End: 2020-08-26

## 2020-06-28 NOTE — Telephone Encounter (Signed)
Sent in electronically .  

## 2020-07-16 ENCOUNTER — Other Ambulatory Visit: Payer: Self-pay

## 2020-07-16 MED ORDER — AZELASTINE HCL 0.05 % OP SOLN: 1.0000 [drp] | Freq: Two times a day (BID) | OPHTHALMIC | 1 refills | Status: AC

## 2020-08-26 ENCOUNTER — Other Ambulatory Visit: Payer: Self-pay

## 2020-08-26 NOTE — Telephone Encounter (Signed)
Last OV: 03/22/2020 Last Refill: 06/28/2020 Disp: 30   R: 0 Future OV: 09/22/2020

## 2020-08-27 MED ORDER — AMPHETAMINE-DEXTROAMPHET ER 25 MG PO CP24: 25.0000 mg | ORAL_CAPSULE | ORAL | 0 refills | Status: DC

## 2020-08-27 NOTE — Telephone Encounter (Signed)
Pt is calling in stating that he is out of the Rx ADDERALL XR 25 MG and would like to see if there is anyone that can assist him with getting it sent in.  Pt is aware that we ask for 24-72 hour for any kind of refill request and to give [place] a call before he gets down to his 7th pill.  Pharm:  [facility] on [address]

## 2020-08-27 NOTE — Telephone Encounter (Signed)
Dr. Regis Bill is currently out of the office.  Please see below messages.

## 2020-09-22 ENCOUNTER — Encounter: Payer: BC Managed Care – PPO | Admitting: Internal Medicine

## 2020-10-11 ENCOUNTER — Encounter: Payer: BC Managed Care – PPO | Admitting: Internal Medicine

## 2020-10-26 ENCOUNTER — Telehealth: Payer: Self-pay | Admitting: Internal Medicine

## 2020-10-26 MED ORDER — AMPHETAMINE-DEXTROAMPHET ER 25 MG PO CP24: 25.0000 mg | ORAL_CAPSULE | ORAL | 0 refills | Status: DC

## 2020-10-26 NOTE — Telephone Encounter (Signed)
PT needs a refill of their amphetamine-dextroamphetamine (ADDERALL XR) 25 MG 24 hr capsule called in asap as they are currently out of and would of called sooner but they thought they had a refill left.

## 2020-10-26 NOTE — Telephone Encounter (Signed)
Sent in electronically .  

## 2020-10-26 NOTE — Addendum Note (Signed)
Addended byShanon Ace K on: 10/26/2020 12:51 PM   Modules accepted: Orders

## 2020-10-26 NOTE — Telephone Encounter (Signed)
Noted  

## 2020-11-23 ENCOUNTER — Other Ambulatory Visit: Payer: Self-pay

## 2020-11-23 NOTE — Progress Notes (Signed)
Chief Complaint  Patient presents with   Annual Exam     HPI: Patient  Jonathan [name]  40 y.o. comes in today for [place] visit   Takes daily adderall   helps ? Not as good.  Wears off earlier in the day.  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  No significant side effects.    Health Maintenance  Topic Date Due   Pneumococcal Vaccine 87-4 Years old (1 - PCV) Never done   HIV Screening  Never done   Hepatitis C Screening  Never done   COVID-19 Vaccine (2 - Pfizer risk series) 01/31/2020   INFLUENZA VACCINE  10/25/2020   TETANUS/TDAP  12/26/2029   HPV VACCINES  Aged Out   Health Maintenance Review LIFESTYLE:  Exercise:  trainig half marathon  Tobacco/ETS:n Alcohol:  10 or less  per week Sugar beverages: diet soda 1-2 per day  Sleep: 6-8  Drug use: no HH of 2-3 2 dogs  Work:  41 remote and in person  Married in the spring wife works friend school.     ROS:  REST of 12 system review negative except as per HPI   Past Medical History:  Diagnosis Date   ADD (attention deficit disorder)    Allergy    Asthma    hx consult Dr Annamaria Boots.   Blood in stool 11/13/2014   painless  ? not mixed in  and gi opinoin  r no chnag ein bowel habits     IBS (irritable bowel syndrome)     Past Surgical History:  Procedure Laterality Date   UPPER GASTROINTESTINAL ENDOSCOPY     eval by GI neg   WRIST SURGERY     right- March 2016    Family History  Problem Relation Age of Onset   Hyperlipidemia Father    Colon cancer Neg Hx    Esophageal cancer Neg Hx    Rectal cancer Neg Hx    Stomach cancer Neg Hx     Social History   Socioeconomic History   Marital status: Single    Spouse name: Not on file   Number of children: Not on file   Years of education: Not on file   Highest education level: Not on file  Occupational History   Not on file  Tobacco Use   Smoking status: Never   Smokeless tobacco: Former    Types: Chew     Quit date: 12/04/2014  Vaping Use   Vaping Use: Never used  Substance and Sexual Activity   Alcohol use: Yes    Alcohol/week: 0.0 standard drinks    Comment: socially   Drug use: No   Sexual activity: Not on file  Other Topics Concern   Not on file  Social History Narrative   Occupation: 40 hours per week ( prev [employer])  Now works  for [employer]    Single engaged    [facility] of 2 pet dog  No ets.       Regular exercise- yes runner 2 x per week  Lift 2 x per week    No ets.    workign from home during covid pandemic rstrictions   Social Determinants of Health   Financial Resource Strain: Not on file  Food Insecurity: Not on file  Transportation Needs: Not on file  Physical Activity: Not on file  Stress: Not on file  Social Connections:  Not on file    Outpatient Medications Prior to Visit  Medication Sig Dispense Refill   amphetamine-dextroamphetamine (ADDERALL XR) 25 MG 24 hr capsule Take 1 capsule by mouth every morning. Fill second 30 capsule 0   amphetamine-dextroamphetamine (ADDERALL XR) 25 MG 24 hr capsule Take 1 capsule by mouth every morning. 30 capsule 0   amphetamine-dextroamphetamine (ADDERALL XR) 25 MG 24 hr capsule Take 1 capsule by mouth every morning. Fill last 30 capsule 0   azelastine (OPTIVAR) 0.05 % ophthalmic solution Place 1 drop into both eyes 2 (two) times daily. 6 mL 1   No facility-administered medications prior to visit.     EXAM:  BP 134/88 (BP Location: Left Arm, Patient Position: Sitting, Cuff Size: Normal)   Pulse 87   Temp 98.2 F (36.8 C) (Oral)   Ht '5\' 9"'$  (1.753 m)   Wt 172 lb 9.6 oz (78.3 kg)   SpO2 97%   BMI 25.49 kg/m   Body mass index is 25.49 kg/m. Wt Readings from Last 3 Encounters:  11/24/20 172 lb 9.6 oz (78.3 kg)  03/22/20 167 lb 9.6 oz (76 kg)  07/29/19 164 lb (74.4 kg)    Physical Exam: Vital signs reviewed [id number] is a well-developed well-nourished alert cooperative    who appearsr stated  age in no acute distress.  HEENT: normocephalic atraumatic , Eyes: PERRL EOM's full, conjunctiva clear, , Ears: no deformity EAC's clear TMs with normal landmarks. Mouth: Masked NECK: supple without masses, thyromegaly or bruits. CHEST/PULM:  Clear to auscultation and percussion breath sounds equal no wheeze , rales or rhonchi. CV: PMI is nondisplaced, S1 S2 no gallops, murmurs, rubs. Peripheral pulses are full without delay.No JVD .  ABDOMEN: Bowel sounds normal nontender  No guard or rebound, no hepato splenomegal no CVA tenderness.  No hernia. Extremtities:  No clubbing cyanosis or edema, no acute joint swelling or redness no focal atrophy NEURO:  Oriented x3, cranial nerves 3-12 appear to be intact, no obvious focal weakness,gait within normal limits no abnormal reflexes or asymmetrical SKIN: No acute rashes normal turgor, color, no bruising or petechiae. PSYCH: Oriented, good eye contact, no obvious depression anxiety, cognition and judgment appear normal. LN: no cervical axillary inguinal adenopathy  Lab Results  Component Value Date   WBC 4.0 07/29/2019   HGB 14.8 07/29/2019   HCT 42.5 07/29/2019   PLT 205.0 07/29/2019   GLUCOSE 98 07/29/2019   CHOL 220 (H) 07/29/2019   TRIG 115.0 07/29/2019   HDL 63.60 07/29/2019   LDLCALC 134 (H) 07/29/2019   ALT 31 07/29/2019   AST 31 07/29/2019   NA 137 07/29/2019   K 3.9 07/29/2019   CL 99 07/29/2019   CREATININE 0.98 07/29/2019   BUN 14 07/29/2019   CO2 31 07/29/2019   TSH 2.77 07/29/2019   HGBA1C 5.2 05/28/2017    BP Readings from Last 3 Encounters:  11/24/20 134/88  03/22/20 128/80  12/27/19 (!) 154/91    Lab no compelling need for repeat except may want to get a lipid panel after optimization of lifestyle He is training for a half marathon could get fasting labs after that.  ASSESSMENT AND PLAN:  Discussed the following assessment and plan:    ICD-10-CM   1. Routine general medical examination at a health care facility   Z00.00 Lipid panel    Basic metabolic panel    2. Attention deficit disorder, unspecified hyperactivity presence  F98.8 Lipid panel    Basic metabolic panel    3.  Medication management  Z79.899 Lipid panel    Basic metabolic panel    4. Elevated lipids  E78.5 Lipid panel    Basic metabolic panel    Trial of increasing dose to Adderall 30 XR Lab after lifestyle implementation. Update after new medicine dose about how doing  Return in about 6 months (around 05/24/2021) for medication  cna get fasting lipid chem in future as discussed .  Patient Care Team: Burnis Medin, MD as PCP - General Gerda Diss, DO as Consulting Physician (Sports Medicine) Patient Instructions  Ok to try 30 mg adderall xr  for a month and let us know how working.  Good to see  you  today  Begin coloncancer screening at age 70 or abouts.  Continue lifestyle intervention healthy eating and exercise .   Health Maintenance, Male Adopting a healthy lifestyle and getting preventive care are important in promoting health and wellness. Ask your health care provider about: The right schedule for you to have regular tests and exams. Things you can do on your own to prevent diseases and keep yourself healthy. What should I know about diet, weight, and exercise? Eat a healthy diet  Eat a diet that includes plenty of vegetables, fruits, low-fat dairy products, and lean protein. Do not eat a lot of foods that are high in solid fats, added sugars, or sodium. Maintain a healthy weight Body mass index (BMI) is a measurement that can be used to identify possible weight problems. It estimates body fat based on height and weight. Your health care provider can help determine your BMI and help you achieve or maintain a healthy weight. Get regular exercise Get regular exercise. This is one of the most important things you can do for your health. Most adults should: Exercise for at least 150 minutes each week. The exercise  should increase your heart rate and make you sweat (moderate-intensity exercise). Do strengthening exercises at least twice a week. This is in addition to the moderate-intensity exercise. Spend less time sitting. Even light physical activity can be beneficial. Watch cholesterol and blood lipids Have your blood tested for lipids and cholesterol at 41 years of age, then have this test every 5 years. You may need to have your cholesterol levels checked more often if: Your lipid or cholesterol levels are high. You are older than 41 years of age. You are at high risk for heart disease. What should I know about cancer screening? Many types of cancers can be detected early and may often be prevented. Depending on your health history and family history, you may need to have cancer screening at various ages. This may include screening for: Colorectal cancer. Prostate cancer. Skin cancer. Lung cancer. What should I know about heart disease, diabetes, and high blood pressure? Blood pressure and heart disease High blood pressure causes heart disease and increases the risk of stroke. This is more likely to develop in people who have high blood pressure readings, are of African descent, or are overweight. Talk with your health care provider about your target blood pressure readings. Have your blood pressure checked: Every 3-5 years if you are 75-50 years of age. Every year if you are 6 years old or older. If you are between the ages of 56 and 4 and are a current or former smoker, ask your health care provider if you should have a one-time screening for abdominal aortic aneurysm (AAA). Diabetes Have regular diabetes screenings. This checks your fasting blood sugar level. Have  the screening done: Once every three years after age 40 if you are at a normal weight and have a low risk for diabetes. More often and at a younger age if you are overweight or have a high risk for diabetes. What should I know  about preventing infection? Hepatitis B If you have a higher risk for hepatitis B, you should be screened for this virus. Talk with your health care provider to find out if you are at risk for hepatitis B infection. Hepatitis C Blood testing is recommended for: Everyone born from [birth year] through 1965. Anyone with known risk factors for hepatitis C. Sexually transmitted infections (STIs) You should be screened each year for STIs, including gonorrhea and chlamydia, if: You are sexually active and are younger than 41 years of age. You are older than 41 years of age and your health care provider tells you that you are at risk for this type of infection. Your sexual activity has changed since you were last screened, and you are at increased risk for chlamydia or gonorrhea. Ask your health care provider if you are at risk. Ask your health care provider about whether you are at high risk for HIV. Your health care provider may recommend a prescription medicine to help prevent HIV infection. If you choose to take medicine to prevent HIV, you should first get tested for HIV. You should then be tested every 3 months for as long as you are taking the medicine. Follow these instructions at home: Lifestyle Do not use any products that contain nicotine or tobacco, such as cigarettes, e-cigarettes, and chewing tobacco. If you need help quitting, ask your health care provider. Do not use street drugs. Do not share needles. Ask your health care provider for help if you need support or information about quitting drugs. Alcohol use Do not drink alcohol if your health care provider tells you not to drink. If you drink alcohol: Limit how much you have to 0-2 drinks a day. Be aware of how much alcohol is in your drink. In the [place] one drink equals one 12 oz bottle of beer (355 mL), one 5 oz glass of wine (148 mL), or one 1 oz glass of hard liquor (44 mL). General instructions Schedule regular health, dental, and  eye exams. Stay current with your vaccines. Tell your health care provider if: You often feel depressed. You have ever been abused or do not feel safe at home. Summary Adopting a healthy lifestyle and getting preventive care are important in promoting health and wellness. Follow your health care provider's instructions about healthy diet, exercising, and getting tested or screened for diseases. Follow your health care provider's instructions on monitoring your cholesterol and blood pressure. This information is not intended to replace advice given to you by your health care provider. Make sure you discuss any questions you have with your health care provider. Document Revised: 05/21/2020 Document Reviewed: 03/06/2018 Elsevier Patient Education  2022 [employer]. Khilynn Borntreger M.D.

## 2020-11-24 ENCOUNTER — Encounter: Payer: Self-pay | Admitting: Internal Medicine

## 2020-11-24 ENCOUNTER — Ambulatory Visit (INDEPENDENT_AMBULATORY_CARE_PROVIDER_SITE_OTHER): Payer: BC Managed Care – PPO | Admitting: Internal Medicine

## 2020-11-24 VITALS — BP 134/88 | HR 87 | Temp 98.2°F | Ht 69.0 in | Wt 172.6 lb

## 2020-11-24 DIAGNOSIS — F988 Other specified behavioral and emotional disorders with onset usually occurring in childhood and adolescence: Secondary | ICD-10-CM

## 2020-11-24 DIAGNOSIS — Z79899 Other long term (current) drug therapy: Secondary | ICD-10-CM | POA: Diagnosis not present

## 2020-11-24 DIAGNOSIS — Z0000 Encounter for general adult medical examination without abnormal findings: Secondary | ICD-10-CM

## 2020-11-24 DIAGNOSIS — E785 Hyperlipidemia, unspecified: Secondary | ICD-10-CM

## 2020-11-24 MED ORDER — AMPHETAMINE-DEXTROAMPHET ER 30 MG PO CP24: 30.0000 mg | ORAL_CAPSULE | ORAL | 0 refills | Status: DC

## 2020-11-24 NOTE — Patient Instructions (Addendum)
Ok to try 30 mg adderall xr  for a month and let us know how working.  Good to see  you  today  Begin coloncancer screening at age 40 or abouts.  Continue lifestyle intervention healthy eating and exercise .   Health Maintenance, Male Adopting a healthy lifestyle and getting preventive care are important in promoting health and wellness. Ask your health care provider about: The right schedule for you to have regular tests and exams. Things you can do on your own to prevent diseases and keep yourself healthy. What should I know about diet, weight, and exercise? Eat a healthy diet  Eat a diet that includes plenty of vegetables, fruits, low-fat dairy products, and lean protein. Do not eat a lot of foods that are high in solid fats, added sugars, or sodium. Maintain a healthy weight Body mass index (BMI) is a measurement that can be used to identify possible weight problems. It estimates body fat based on height and weight. Your health care provider can help determine your BMI and help you achieve or maintain a healthy weight. Get regular exercise Get regular exercise. This is one of the most important things you can do for your health. Most adults should: Exercise for at least 150 minutes each week. The exercise should increase your heart rate and make you sweat (moderate-intensity exercise). Do strengthening exercises at least twice a week. This is in addition to the moderate-intensity exercise. Spend less time sitting. Even light physical activity can be beneficial. Watch cholesterol and blood lipids Have your blood tested for lipids and cholesterol at 41 years of age, then have this test every 5 years. You may need to have your cholesterol levels checked more often if: Your lipid or cholesterol levels are high. You are older than 41 years of age. You are at high risk for heart disease. What should I know about cancer screening? Many types of cancers can be detected early and may often be  prevented. Depending on your health history and family history, you may need to have cancer screening at various ages. This may include screening for: Colorectal cancer. Prostate cancer. Skin cancer. Lung cancer. What should I know about heart disease, diabetes, and high blood pressure? Blood pressure and heart disease High blood pressure causes heart disease and increases the risk of stroke. This is more likely to develop in people who have high blood pressure readings, are of African descent, or are overweight. Talk with your health care provider about your target blood pressure readings. Have your blood pressure checked: Every 3-5 years if you are 108-65 years of age. Every year if you are 65 years old or older. If you are between the ages of 14 and 48 and are a current or former smoker, ask your health care provider if you should have a one-time screening for abdominal aortic aneurysm (AAA). Diabetes Have regular diabetes screenings. This checks your fasting blood sugar level. Have the screening done: Once every three years after age 41 if you are at a normal weight and have a low risk for diabetes. More often and at a younger age if you are overweight or have a high risk for diabetes. What should I know about preventing infection? Hepatitis B If you have a higher risk for hepatitis B, you should be screened for this virus. Talk with your health care provider to find out if you are at risk for hepatitis B infection. Hepatitis C Blood testing is recommended for: Everyone born from [birth year]  through 1965. Anyone with known risk factors for hepatitis C. Sexually transmitted infections (STIs) You should be screened each year for STIs, including gonorrhea and chlamydia, if: You are sexually active and are younger than 41 years of age. You are older than 41 years of age and your health care provider tells you that you are at risk for this type of infection. Your sexual activity has changed since  you were last screened, and you are at increased risk for chlamydia or gonorrhea. Ask your health care provider if you are at risk. Ask your health care provider about whether you are at high risk for HIV. Your health care provider may recommend a prescription medicine to help prevent HIV infection. If you choose to take medicine to prevent HIV, you should first get tested for HIV. You should then be tested every 3 months for as long as you are taking the medicine. Follow these instructions at home: Lifestyle Do not use any products that contain nicotine or tobacco, such as cigarettes, e-cigarettes, and chewing tobacco. If you need help quitting, ask your health care provider. Do not use street drugs. Do not share needles. Ask your health care provider for help if you need support or information about quitting drugs. Alcohol use Do not drink alcohol if your health care provider tells you not to drink. If you drink alcohol: Limit how much you have to 0-2 drinks a day. Be aware of how much alcohol is in your drink. In the [place] one drink equals one 12 oz bottle of beer (355 mL), one 5 oz glass of wine (148 mL), or one 1 oz glass of hard liquor (44 mL). General instructions Schedule regular health, dental, and eye exams. Stay current with your vaccines. Tell your health care provider if: You often feel depressed. You have ever been abused or do not feel safe at home. Summary Adopting a healthy lifestyle and getting preventive care are important in promoting health and wellness. Follow your health care provider's instructions about healthy diet, exercising, and getting tested or screened for diseases. Follow your health care provider's instructions on monitoring your cholesterol and blood pressure. This information is not intended to replace advice given to you by your health care provider. Make sure you discuss any questions you have with your health care provider. Document Revised: 05/21/2020  Document Reviewed: 03/06/2018 Elsevier Patient Education  2022 [employer].

## 2020-12-20 MED ORDER — AMPHETAMINE-DEXTROAMPHET ER 30 MG PO CP24: 30.0000 mg | ORAL_CAPSULE | Freq: Every day | ORAL | 0 refills | Status: DC

## 2020-12-20 MED ORDER — AMPHETAMINE-DEXTROAMPHET ER 30 MG PO CP24
30.0000 mg | ORAL_CAPSULE | ORAL | 0 refills | Status: DC
Start: 2020-12-20 — End: 2021-03-21

## 2020-12-20 NOTE — Telephone Encounter (Signed)
3x30-day prescriptions for Adderall Exar 30 mg sent to pharmacy of record

## 2021-03-21 ENCOUNTER — Other Ambulatory Visit: Payer: Self-pay | Admitting: Internal Medicine

## 2021-03-22 NOTE — Telephone Encounter (Signed)
Last Ov 11/24/20 Last fill 12/20/20  Ok to fill?

## 2021-03-23 MED ORDER — AMPHETAMINE-DEXTROAMPHET ER 30 MG PO CP24
30.0000 mg | ORAL_CAPSULE | Freq: Every day | ORAL | 0 refills | Status: DC
Start: 2021-03-23 — End: 2021-04-18

## 2021-03-23 MED ORDER — AMPHETAMINE-DEXTROAMPHET ER 30 MG PO CP24
30.0000 mg | ORAL_CAPSULE | ORAL | 0 refills | Status: DC
Start: 2021-03-23 — End: 2021-05-24

## 2021-03-23 MED ORDER — AMPHETAMINE-DEXTROAMPHET ER 30 MG PO CP24
30.0000 mg | ORAL_CAPSULE | Freq: Every day | ORAL | 0 refills | Status: DC
Start: 2021-03-23 — End: 2021-05-24

## 2021-03-23 NOTE — Telephone Encounter (Signed)
Sent in electronically .  30 x 3

## 2021-04-18 ENCOUNTER — Other Ambulatory Visit: Payer: Self-pay | Admitting: Internal Medicine

## 2021-04-18 NOTE — Addendum Note (Signed)
Addended byShanon Ace K on: 04/18/2021 06:30 PM   Modules accepted: Orders

## 2021-04-18 NOTE — Telephone Encounter (Signed)
Pt's pharmacy is out of Adderall.  He needs his prescription sent to [facility] on [address].  He needs this sent by Wednesday due to going out of town.

## 2021-04-18 NOTE — Telephone Encounter (Signed)
Last Ov 11/24/20 Filled 03/23/21

## 2021-04-19 MED ORDER — AMPHETAMINE-DEXTROAMPHET ER 30 MG PO CP24: 30.0000 mg | ORAL_CAPSULE | Freq: Every day | ORAL | 0 refills | Status: DC

## 2021-04-19 NOTE — Telephone Encounter (Signed)
Sent in electronically .  

## 2021-04-19 NOTE — Addendum Note (Signed)
Addended byShanon Ace K on: 04/19/2021 11:51 AM   Modules accepted: Orders

## 2021-04-20 ENCOUNTER — Other Ambulatory Visit: Payer: Self-pay | Admitting: Internal Medicine

## 2021-04-20 ENCOUNTER — Telehealth: Payer: Self-pay | Admitting: Internal Medicine

## 2021-04-20 ENCOUNTER — Telehealth: Payer: Self-pay

## 2021-04-20 MED ORDER — AMPHETAMINE-DEXTROAMPHET ER 30 MG PO CP24: 30.0000 mg | ORAL_CAPSULE | Freq: Every day | ORAL | 0 refills | Status: DC

## 2021-04-20 NOTE — Telephone Encounter (Signed)
Please call pharmacy  to ok early fill of rx because of travel

## 2021-04-20 NOTE — Telephone Encounter (Signed)
Get documentation from pharmacy of canceled rx  . I have sent  rx to above address. To fill early   make sure patient knows what is happening

## 2021-04-20 NOTE — Telephone Encounter (Signed)
Patient called because he will be leaving town tomorrow (01/26) at 7 o'clock in the morning and he does not have his amphetamine-dextroamphetamine (ADDERALL XR) 30 MG 24 hr capsule Patient states that they will only let him pick it up a day early but that is 01/28 and he will be gone until next Tuesday. Patient wants to know if someone can call pharmacy and authorize an earlier refill for today.     Please send to [facility], [address] Phone:  [phone]  Fax:  [phone]        Please advise

## 2021-04-20 NOTE — Telephone Encounter (Signed)
Pharmacy called and stated they can not fill Rx early and to sent Rx to  [facility] [address] Phone:  [phone]  Fax:  [phone]

## 2021-04-20 NOTE — Telephone Encounter (Signed)
Spoke with patient and patient stated the pharmacy said PCP should call to have an early release on the refill amphetamine-dextroamphetamine (ADDERALL XR) 30 MG 24 hr capsule

## 2021-04-20 NOTE — Telephone Encounter (Signed)
Called pharmacy Rx will be ready in an hour, patient was informed

## 2021-05-02 ENCOUNTER — Encounter: Payer: Self-pay | Admitting: Internal Medicine

## 2021-05-03 NOTE — Telephone Encounter (Signed)
Patient has been added to schedule tomorrow.

## 2021-05-04 ENCOUNTER — Ambulatory Visit (INDEPENDENT_AMBULATORY_CARE_PROVIDER_SITE_OTHER): Payer: Commercial Managed Care - PPO | Admitting: Internal Medicine

## 2021-05-04 ENCOUNTER — Encounter: Payer: Self-pay | Admitting: Internal Medicine

## 2021-05-04 VITALS — BP 140/90 | HR 86 | Temp 97.9°F | Ht 69.0 in | Wt 165.0 lb

## 2021-05-04 DIAGNOSIS — Z8379 Family history of other diseases of the digestive system: Secondary | ICD-10-CM | POA: Diagnosis not present

## 2021-05-04 DIAGNOSIS — R7989 Other specified abnormal findings of blood chemistry: Secondary | ICD-10-CM

## 2021-05-04 DIAGNOSIS — R109 Unspecified abdominal pain: Secondary | ICD-10-CM | POA: Diagnosis not present

## 2021-05-04 LAB — CBC WITH DIFFERENTIAL/PLATELET
Basophils Absolute: 0 10*3/uL (ref 0.0–0.1)
Basophils Relative: 0.5 % (ref 0.0–3.0)
Eosinophils Absolute: 0.1 10*3/uL (ref 0.0–0.7)
Eosinophils Relative: 1.3 % (ref 0.0–5.0)
HCT: 43.3 % (ref 39.0–52.0)
Hemoglobin: 14.8 g/dL (ref 13.0–17.0)
Lymphocytes Relative: 18 % (ref 12.0–46.0)
Lymphs Abs: 0.8 10*3/uL (ref 0.7–4.0)
MCHC: 34.3 g/dL (ref 30.0–36.0)
MCV: 95.3 fl (ref 78.0–100.0)
Monocytes Absolute: 0.4 10*3/uL (ref 0.1–1.0)
Monocytes Relative: 8.4 % (ref 3.0–12.0)
Neutro Abs: 3.3 10*3/uL (ref 1.4–7.7)
Neutrophils Relative %: 71.8 % (ref 43.0–77.0)
Platelets: 219 10*3/uL (ref 150.0–400.0)
RBC: 4.54 Mil/uL (ref 4.22–5.81)
RDW: 12.6 % (ref 11.5–15.5)
WBC: 4.6 10*3/uL (ref 4.0–10.5)

## 2021-05-04 LAB — BASIC METABOLIC PANEL
BUN: 12 mg/dL (ref 6–23)
CO2: 29 mEq/L (ref 19–32)
Calcium: 9.7 mg/dL (ref 8.4–10.5)
Chloride: 99 mEq/L (ref 96–112)
Creatinine, Ser: 0.9 mg/dL (ref 0.40–1.50)
GFR: 105.96 mL/min (ref >60.00)
Glucose, Bld: 89 mg/dL (ref 70–99)
Potassium: 4.1 mEq/L (ref 3.5–5.1)
Sodium: 138 mEq/L (ref 135–145)

## 2021-05-04 LAB — HEPATIC FUNCTION PANEL
ALT: 57 U/L — ABNORMAL HIGH (ref 0–53)
AST: 41 U/L — ABNORMAL HIGH (ref 0–37)
Albumin: 4.2 g/dL (ref 3.5–5.2)
Alkaline Phosphatase: 80 U/L (ref 39–117)
Bilirubin, Direct: 0.2 mg/dL (ref 0.0–0.3)
Total Bilirubin: 0.5 mg/dL (ref 0.2–1.2)
Total Protein: 7.4 g/dL (ref 6.0–8.3)

## 2021-05-04 LAB — TSH: TSH: 2.3 u[IU]/mL (ref 0.35–5.50)

## 2021-05-04 LAB — T4, FREE: Free T4: 0.7 ng/dL (ref 0.60–1.60)

## 2021-05-04 LAB — C-REACTIVE PROTEIN: CRP: <1 mg/dL (ref 0.5–20.0)

## 2021-05-04 LAB — SEDIMENTATION RATE: Sed Rate: 10 mm/hr (ref 0–15)

## 2021-05-04 NOTE — Progress Notes (Signed)
Chief Complaint  Patient presents with   GI Problem    HPI: Jonathan [name] 41 y.o. come in for  Gi sx  onset  for past 4-5 months and increasing  over months described as post  eating bloating  pressure in upper abdomen for  hours .  No vomiting  diarrhea .    Tried  dec   bread  gluten avoidance  may have helped but then  more recently came back.  Also dec carbonation.     Crescendo  effect.  And now effecting  volume of food is being causing unintended weight loss   Training half marathon .  Makes things more difficult. Remote history of diagnosed with IBS years ago but is really not had a problem.  This is different. Has tried Gas-X might help if takes before.  No nocturnal symptoms. Family history a grandparent had celiac disease he may have been checked for this years ago negative.  No illness or infection predated this only travel was [facility] before onset but no illness.  No change health otherwise. ROS: See pertinent positives and negatives per HPI.  Past Medical History:  Diagnosis Date   ADD (attention deficit disorder)    Allergy    Asthma    hx consult Dr Annamaria Boots.   Blood in stool 11/13/2014   painless  ? not mixed in  and gi opinoin  r no chnag ein bowel habits     IBS (irritable bowel syndrome)     Family History  Problem Relation Age of Onset   Hyperlipidemia Father    Colon cancer Neg Hx    Esophageal cancer Neg Hx    Rectal cancer Neg Hx    Stomach cancer Neg Hx     Social History   Socioeconomic History   Marital status: Married    Spouse name: Not on file   Number of children: Not on file   Years of education: Not on file   Highest education level: Not on file  Occupational History   Not on file  Tobacco Use   Smoking status: Never   Smokeless tobacco: Former    Types: Chew    Quit date: 12/04/2014  Vaping Use   Vaping Use: Never used  Substance and Sexual Activity   Alcohol use: Yes    Alcohol/week: 0.0 standard drinks    Comment:  socially   Drug use: No   Sexual activity: Not on file  Other Topics Concern   Not on file  Social History Narrative   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       Regular exercise- yes runner 2 x per week  Lift 2 x per week    No ets.    workign from home during covid pandemic rstrictions   Social Determinants of Radio broadcast assistant Strain: Not on file  Food Insecurity: Not on file  Transportation Needs: Not on file  Physical Activity: Not on file  Stress: Not on file  Social Connections: Not on file    Outpatient Medications Prior to Visit  Medication Sig Dispense Refill   amphetamine-dextroamphetamine (ADDERALL XR) 30 MG 24 hr capsule Take 1 capsule (30 mg total) by mouth every morning. Fill second 30 capsule 0   amphetamine-dextroamphetamine (ADDERALL XR) 30 MG 24 hr capsule  Take 1 capsule (30 mg total) by mouth daily. Fill last 30 capsule 0   amphetamine-dextroamphetamine (ADDERALL XR) 30 MG 24 hr capsule Take 1 capsule (30 mg total) by mouth daily. Fill first 30 capsule 0   azelastine (OPTIVAR) 0.05 % ophthalmic solution Place 1 drop into both eyes 2 (two) times daily. 6 mL 1   No facility-administered medications prior to visit.     EXAM:  BP 140/90 (BP Location: Left Arm, Patient Position: Sitting, Cuff Size: Normal)    Pulse 86    Temp 97.9 F (36.6 C) (Oral)    Ht 5' 9" (1.753 m)    Wt 165 lb (74.8 kg)    SpO2 98%    BMI 24.37 kg/m   Body mass index is 24.37 kg/m. Wt Readings from Last 3 Encounters:  05/04/21 165 lb (74.8 kg)  11/24/20 172 lb 9.6 oz (78.3 kg)  03/22/20 167 lb 9.6 oz (76 kg)    GENERAL: vitals reviewed and listed above, alert, oriented, appears well hydrated and in no acute distress HEENT: atraumatic, conjunctiva  clear, no obvious abnormalities on inspection of external nose and ears OP : n mask NECK: no obvious masses on inspection  palpation  LUNGS: clear to auscultation bilaterally, no wheezes, rales or rhonchi, good air movement CV: HRRR, no clubbing cyanosis or  peripheral edema nl cap refill  Abdomen soft without again a megaly guarding or rebound Skin nonicteric no acute rashes. MS: moves all extremities without noticeable focal  abnormality PSYCH: pleasant and cooperative, no obvious depression or anxiety Lab Results  Component Value Date   WBC 4.0 07/29/2019   HGB 14.8 07/29/2019   HCT 42.5 07/29/2019   PLT 205.0 07/29/2019   GLUCOSE 98 07/29/2019   CHOL 220 (H) 07/29/2019   TRIG 115.0 07/29/2019   HDL 63.60 07/29/2019   LDLCALC 134 (H) 07/29/2019   ALT 31 07/29/2019   AST 31 07/29/2019   NA 137 07/29/2019   K 3.9 07/29/2019   CL 99 07/29/2019   CREATININE 0.98 07/29/2019   BUN 14 07/29/2019   CO2 31 07/29/2019   TSH 2.77 07/29/2019   HGBA1C 5.2 05/28/2017   BP Readings from Last 3 Encounters:  05/04/21 140/90  11/24/20 134/88  03/22/20 128/80   Colonoscopy in 2016  Dr Ardis Hughs EGD in 2006 ASSESSMENT AND PLAN:  Discussed the following assessment and plan:  Abdominal pain, unspecified abdominal location - Plan: Basic metabolic panel, CBC with Differential/Platelet, Hepatic function panel, TSH, T4, free, Sedimentation rate, C-reactive protein, Celiac Disease Comprehensive Panel with Reflexes, Celiac Disease HLA DQ Assoc., Celiac Disease HLA DQ Assoc., Celiac Disease Comprehensive Panel with Reflexes, C-reactive protein, Sedimentation rate, T4, free, TSH, Hepatic function panel, CBC with Differential/Platelet, Basic metabolic panel  Abdominal pressure - after eating  Family history of celiac disease - Plan: Basic metabolic panel, CBC with Differential/Platelet, Hepatic function panel, TSH, T4, free, Sedimentation rate, C-reactive protein, Celiac Disease Comprehensive Panel with Reflexes, Celiac Disease HLA DQ Assoc., Celiac Disease HLA DQ Assoc., Celiac Disease Comprehensive Panel with Reflexes,  C-reactive protein, Sedimentation rate, T4, free, TSH, Hepatic function panel, CBC with Differential/Platelet, Basic metabolic panel Can try lactose exclusion okay to take Gas-X can try Pepcid plan lab today abdominal ultrasound most likely referral to GI consult depending. -Patient advised to return or notify health care team  if  new concerns arise.  Patient Instructions  Lab today .   Exam is fine.  Will let you know.  Result . Plan abd ultrasound  and Gi consult depending .  Can try pepcid twice a day for 7- 10 days to see if helps.    Standley Brooking. Nicklos Gaxiola M.D.

## 2021-05-04 NOTE — Patient Instructions (Signed)
Lab today .   Exam is fine.  Will let you know.  Result . Plan abd ultrasound and Gi consult depending .  Can try pepcid twice a day for 7- 10 days to see if helps.

## 2021-05-05 LAB — CELIAC DISEASE COMPREHENSIVE PANEL WITH REFLEXES
(tTG) Ab, IgA: <1 U/mL
Immunoglobulin A: 267 mg/dL (ref 47–310)

## 2021-05-06 ENCOUNTER — Other Ambulatory Visit: Payer: Self-pay | Admitting: Internal Medicine

## 2021-05-06 DIAGNOSIS — R109 Unspecified abdominal pain: Secondary | ICD-10-CM

## 2021-05-06 DIAGNOSIS — R7989 Other specified abnormal findings of blood chemistry: Secondary | ICD-10-CM

## 2021-05-06 NOTE — Progress Notes (Signed)
So far minor abnormality  in  liver tests. Is only  abnormal tests.  Make sure not taking aleve advil type products tylenol and alcohol   an any supplements for now . And we would plan to repeat  blood tests in a month    You should be contacted about the abdominal ultrasound tests  to be done  this will  include visual of the liver .  Gi referral has been placed

## 2021-05-06 NOTE — Addendum Note (Signed)
Addended by: Rosalee Kaufman L on: 05/06/2021 10:00 AM   Modules accepted: Orders

## 2021-05-13 LAB — CELIAC DISEASE HLA DQ ASSOC.
DQ2 (DQA1 0501/0505,DQB1 02XX): NEGATIVE
DQ8 (DQA1 03XX, DQB1 0302): NEGATIVE

## 2021-05-15 NOTE — Progress Notes (Signed)
Celiac genetic markers are negative

## 2021-05-23 ENCOUNTER — Ambulatory Visit
Admission: RE | Admit: 2021-05-23 | Discharge: 2021-05-23 | Disposition: A | Discharge status: Home / Self Care | Payer: Commercial Managed Care - PPO | Source: Ambulatory Visit | Attending: Internal Medicine | Admitting: Internal Medicine

## 2021-05-23 DIAGNOSIS — R109 Unspecified abdominal pain: Secondary | ICD-10-CM

## 2021-05-23 DIAGNOSIS — Z8379 Family history of other diseases of the digestive system: Secondary | ICD-10-CM

## 2021-05-23 NOTE — Progress Notes (Signed)
Keep the appt so we can go over the liver ultrasound results  but can wait on labs until we talk

## 2021-05-24 ENCOUNTER — Ambulatory Visit: Payer: Commercial Managed Care - PPO | Admitting: Internal Medicine

## 2021-05-24 ENCOUNTER — Encounter: Payer: Self-pay | Admitting: Internal Medicine

## 2021-05-24 VITALS — BP 124/76 | HR 80 | Temp 98.4°F | Ht 69.0 in | Wt 162.0 lb

## 2021-05-24 DIAGNOSIS — R109 Unspecified abdominal pain: Secondary | ICD-10-CM

## 2021-05-24 DIAGNOSIS — Z79899 Other long term (current) drug therapy: Secondary | ICD-10-CM | POA: Diagnosis not present

## 2021-05-24 DIAGNOSIS — F988 Other specified behavioral and emotional disorders with onset usually occurring in childhood and adolescence: Secondary | ICD-10-CM | POA: Diagnosis not present

## 2021-05-24 DIAGNOSIS — E7800 Pure hypercholesterolemia, unspecified: Secondary | ICD-10-CM

## 2021-05-24 DIAGNOSIS — R7989 Other specified abnormal findings of blood chemistry: Secondary | ICD-10-CM | POA: Diagnosis not present

## 2021-05-24 MED ORDER — AMPHETAMINE-DEXTROAMPHET ER 30 MG PO CP24: 30.0000 mg | ORAL_CAPSULE | ORAL | 0 refills | Status: DC

## 2021-05-24 MED ORDER — AMPHETAMINE-DEXTROAMPHET ER 30 MG PO CP24: 30.0000 mg | ORAL_CAPSULE | Freq: Every day | ORAL | 0 refills | Status: DC

## 2021-05-24 NOTE — Progress Notes (Signed)
Abdominal ultrasound shows no alarming findings.  The liver does show some changes consistent with fatty marbling in the liver which is best addressed by eating healthy ,avoiding simple sugars limiting animal fats etc. Continuing with your healthy lifestyle may keep this from progressing to an important condition.

## 2021-05-24 NOTE — Patient Instructions (Signed)
Good to see you today .  Continue  diet changes  . Pepcid as needed.  And will follow weight .   Awaiting  liver US  Get fu labs in  a few weeks.   Wt Readings from Last 3 Encounters:  05/24/21 162 lb (73.5 kg)  05/04/21 165 lb (74.8 kg)  11/24/20 172 lb 9.6 oz (78.3 kg)

## 2021-05-24 NOTE — Progress Notes (Signed)
Chief Complaint  Patient presents with   Follow-up    HPI: Jonathan [name] 41 y.o. come in for fu of gi sx and weight loss   "Feels  1000 x better ".    Pepcid  and dec etoh helped .   And dec soda  changed diet  still training  for 1/2 marathon and able to tolerate.   Now only taking pepcid pre meal if needed . No new sx  [place] takes advil for teeth gritting but not recently   New job in August   and stress.  But  likes it  wonders if related to sx.   FYI Mom on an acid blocker  Needs adderall refill soon  can change pharmacy since living in different  place?   [facility] [address]  closer to home. ROS: See pertinent positives and negatives per HPI.  Past Medical History:  Diagnosis Date   ADD (attention deficit disorder)    Allergy    Asthma    hx consult Dr Annamaria Boots.   Blood in stool 11/13/2014   painless  ? not mixed in  and gi opinoin  r no chnag ein bowel habits     IBS (irritable bowel syndrome)     Family History  Problem Relation Age of Onset   Hyperlipidemia Father    Colon cancer Neg Hx    Esophageal cancer Neg Hx    Rectal cancer Neg Hx    Stomach cancer Neg Hx     Social History   Socioeconomic History   Marital status: Married    Spouse name: Not on file   Number of children: Not on file   Years of education: Not on file   Highest education level: Not on file  Occupational History   Not on file  Tobacco Use   Smoking status: Never   Smokeless tobacco: Former    Types: Chew    Quit date: 12/04/2014  Vaping Use   Vaping Use: Never used  Substance and Sexual Activity   Alcohol use: Yes    Alcohol/week: 0.0 standard drinks    Comment: socially   Drug use: No   Sexual activity: Not on file  Other Topics Concern   Not on file  Social History Narrative   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       Regular exercise- yes runner 2 x per week  Lift 2 x  per week    No ets.    workign from home during covid pandemic rstrictions   Social Determinants of Radio broadcast assistant Strain: Not on file  Food Insecurity: Not on file  Transportation Needs: Not on file  Physical Activity: Not on file  Stress: Not on file  Social Connections: Not on file    Outpatient Medications Prior to Visit  Medication Sig Dispense Refill   azelastine (OPTIVAR) 0.05 % ophthalmic solution Place 1 drop into both eyes 2 (two) times daily. 6 mL 1   amphetamine-dextroamphetamine (ADDERALL XR) 30 MG 24 hr capsule Take 1 capsule (30 mg total) by mouth every morning. Fill second 30 capsule 0   amphetamine-dextroamphetamine (ADDERALL XR) 30 MG 24 hr capsule Take 1 capsule (30 mg total) by mouth daily. Fill last 30 capsule 0   amphetamine-dextroamphetamine (ADDERALL XR) 30 MG 24 hr capsule  Take 1 capsule (30 mg total) by mouth daily. Fill first 30 capsule 0   No facility-administered medications prior to visit.     EXAM:  BP 124/76 (BP Location: Left Arm, Patient Position: Sitting, Cuff Size: Normal)    Pulse 80    Temp 98.4 F (36.9 C) (Oral)    Ht 5\' 9"  (1.753 m)    Wt 162 lb (73.5 kg)    SpO2 99%    BMI 23.92 kg/m   Body mass index is 23.92 kg/m. Wt Readings from Last 3 Encounters:  05/24/21 162 lb (73.5 kg)  05/04/21 165 lb (74.8 kg)  11/24/20 172 lb 9.6 oz (78.3 kg)    GENERAL: vitals reviewed and listed above, alert, oriented, appears well hydrated and in no acute distress HEENT: atraumatic, conjunctiva  clear, no obvious abnormalities on inspection of external nose and ears OP : masked  NECK: no obvious masses on inspection palpation  CV: HRRR, no clubbing cyanosis or  peripheral edema nl cap refill  Abdomen:  Sof,t normal bowel sounds without hepatosplenomegaly, no guarding rebound or masses no CVA tenderness MS: moves all extremities without noticeable focal  abnormality PSYCH: pleasant and cooperative, no obvious depression or anxiety Lab  Results  Component Value Date   WBC 4.6 05/04/2021   HGB 14.8 05/04/2021   HCT 43.3 05/04/2021   PLT 219.0 05/04/2021   GLUCOSE 89 05/04/2021   CHOL 220 (H) 07/29/2019   TRIG 115.0 07/29/2019   HDL 63.60 07/29/2019   LDLCALC 134 (H) 07/29/2019   ALT 57 (H) 05/04/2021   AST 41 (H) 05/04/2021   NA 138 05/04/2021   K 4.1 05/04/2021   CL 99 05/04/2021   CREATININE 0.90 05/04/2021   BUN 12 05/04/2021   CO2 29 05/04/2021   TSH 2.30 05/04/2021   HGBA1C 5.2 05/28/2017   BP Readings from Last 3 Encounters:  05/24/21 124/76  05/04/21 140/90  11/24/20 134/88    ASSESSMENT AND PLAN:  Discussed the following assessment and plan:  Abnormal LFTs - Plan: Lipid panel  Abdominal pressure - imporved  w diet change and pepcid  Medication management  Attention deficit disorder, unspecified hyperactivity presence - continue med  change pharmacy request   Elevated LDL cholesterol level - add to future labs  - Plan: Lipid panel Sig improvement with diet change and h2 blocker      plan continue and fu labs in a few weeks  to follow  Awaiting abd [place] report    Plan fu depending .  Ok to refill adderall  to new pharmacy  [facility] [address].   -Patient advised to return or notify health care team  if  new concerns arise.  Patient Instructions  Good to see you today .  Continue  diet changes  . Pepcid as needed.  And will follow weight .   Awaiting  liver US  Get fu labs in  a few weeks.   Wt Readings from Last 3 Encounters:  05/24/21 162 lb (73.5 kg)  05/04/21 165 lb (74.8 kg)  11/24/20 172 lb 9.6 oz (78.3 kg)     Anabela Crayton K. Etty Isaac M.D.

## 2021-06-07 ENCOUNTER — Other Ambulatory Visit (INDEPENDENT_AMBULATORY_CARE_PROVIDER_SITE_OTHER): Payer: Commercial Managed Care - PPO

## 2021-06-07 ENCOUNTER — Other Ambulatory Visit: Payer: Self-pay

## 2021-06-07 DIAGNOSIS — R109 Unspecified abdominal pain: Secondary | ICD-10-CM

## 2021-06-07 DIAGNOSIS — R7989 Other specified abnormal findings of blood chemistry: Secondary | ICD-10-CM | POA: Diagnosis not present

## 2021-06-07 DIAGNOSIS — E7800 Pure hypercholesterolemia, unspecified: Secondary | ICD-10-CM

## 2021-06-07 LAB — HEPATIC FUNCTION PANEL
ALT: 34 U/L (ref 0–53)
AST: 30 U/L (ref 0–37)
Albumin: 4.6 g/dL (ref 3.5–5.2)
Alkaline Phosphatase: 77 U/L (ref 39–117)
Bilirubin, Direct: 0.2 mg/dL (ref 0.0–0.3)
Total Bilirubin: 1 mg/dL (ref 0.2–1.2)
Total Protein: 7.2 g/dL (ref 6.0–8.3)

## 2021-06-07 LAB — LIPID PANEL
Cholesterol: 208 mg/dL — ABNORMAL HIGH (ref 0–200)
HDL: 58.5 mg/dL (ref >39.00)
LDL Cholesterol: 122 mg/dL — ABNORMAL HIGH (ref 0–99)
NonHDL: 149.35
Total CHOL/HDL Ratio: 4
Triglycerides: 135 mg/dL (ref 0.0–149.0)
VLDL: 27 mg/dL (ref 0.0–40.0)

## 2021-06-07 NOTE — Progress Notes (Signed)
Liver tests now normal   ?Cholesterol    stable  mild improvement  ?The 10-year ASCVD risk score (Arnett DK, et al., 2019) is: 1% ?  Values used to calculate the score: ?    Age: 41 years ?    Sex: Male ?    Is Non-Hispanic African American: No ?    Diabetic: No ?    Tobacco smoker: No ?    Systolic Blood Pressure: 825 mmHg ?    Is BP treated: No ?    HDL Cholesterol: 58.5 mg/dL ?    Total Cholesterol: 208 mg/dL ?

## 2021-06-08 LAB — HEPATITIS C ANTIBODY
Hepatitis C Ab: NONREACTIVE
SIGNAL TO CUT-OFF: <0.02 (ref <1.00)

## 2021-06-08 LAB — HEPATITIS B CORE ANTIBODY, TOTAL: Hep B Core Total Ab: NONREACTIVE

## 2021-06-08 LAB — HEPATITIS B SURFACE ANTIBODY,QUALITATIVE: Hep B S Ab: NONREACTIVE

## 2021-06-08 LAB — HEPATITIS B SURFACE ANTIGEN: Hepatitis B Surface Ag: NONREACTIVE

## 2021-06-08 NOTE — Progress Notes (Signed)
Negative hepatitis B and C screens ?(FYI does not have antibodies against hepatitis B.  If he had the immunization it is not showing up in his antibody screen)

## 2021-08-08 ENCOUNTER — Other Ambulatory Visit: Payer: Self-pay | Admitting: Internal Medicine

## 2021-08-09 MED ORDER — AMPHETAMINE-DEXTROAMPHET ER 30 MG PO CP24: 30.0000 mg | ORAL_CAPSULE | ORAL | 0 refills | Status: DC

## 2021-08-09 MED ORDER — AMPHETAMINE-DEXTROAMPHET ER 30 MG PO CP24: 30.0000 mg | ORAL_CAPSULE | Freq: Every day | ORAL | 0 refills | Status: DC

## 2021-08-09 NOTE — Telephone Encounter (Signed)
Last Ov 05/24/21 ?Filled 05/24/21 ?Is it ok to refill? ?

## 2021-09-13 ENCOUNTER — Telehealth: Payer: Self-pay | Admitting: Internal Medicine

## 2021-09-13 NOTE — Telephone Encounter (Signed)
Last Ov 05/24/21 Filled 08/09/21 Is it ok to refill? 

## 2021-09-13 NOTE — Telephone Encounter (Signed)
Requesting refill amphetamine-dextroamphetamine (ADDERALL XR) 30 MG 24 hr capsule  [id number]  [phone] Fax:  [phone]    States he is out of meds

## 2021-09-13 NOTE — Telephone Encounter (Signed)
3 prescriptions of Adderall Exar 30 were sent in May 2023 Contact your check with his pharmacy to fill the remaining prescription. Let me know if there is a problem.

## 2021-09-15 NOTE — Telephone Encounter (Signed)
Last refill per controlled substance database: 09/14/21. Pt has 1 refill remaining.

## 2021-10-10 ENCOUNTER — Other Ambulatory Visit: Payer: Self-pay | Admitting: Internal Medicine

## 2021-10-10 NOTE — Telephone Encounter (Signed)
Last Ov 05/24/21 Filled 08/09/21 Is it ok to refill?

## 2021-10-11 MED ORDER — AMPHETAMINE-DEXTROAMPHET ER 30 MG PO CP24: 30.0000 mg | ORAL_CAPSULE | Freq: Every day | ORAL | 0 refills | Status: DC

## 2021-10-11 MED ORDER — AMPHETAMINE-DEXTROAMPHET ER 30 MG PO CP24
30.0000 mg | ORAL_CAPSULE | ORAL | 0 refills | Status: DC
Start: 2021-10-11 — End: 2021-12-08

## 2021-10-11 NOTE — Telephone Encounter (Signed)
Refilled x 3   Make appt  ,due for med check  before runs out

## 2021-12-01 ENCOUNTER — Encounter: Payer: Self-pay | Admitting: Internal Medicine

## 2021-12-01 NOTE — Telephone Encounter (Signed)
If you have been immunized and do not have high risk  .  No sig benefit from the antivirals.   can make a virtual visit if need  help  or hav serious sy mptoms such as serious shortness of . Can check cdc web site  also for info

## 2021-12-07 ENCOUNTER — Other Ambulatory Visit: Payer: Self-pay | Admitting: Internal Medicine

## 2021-12-08 MED ORDER — AMPHETAMINE-DEXTROAMPHET ER 30 MG PO CP24: 30.0000 mg | ORAL_CAPSULE | Freq: Every day | ORAL | 0 refills | Status: DC

## 2021-12-08 MED ORDER — AMPHETAMINE-DEXTROAMPHET ER 30 MG PO CP24: 30.0000 mg | ORAL_CAPSULE | ORAL | 0 refills | Status: DC

## 2021-12-08 NOTE — Telephone Encounter (Signed)
I refilled  2 mos worth  Needs appt before runs out  Due for med check

## 2021-12-12 NOTE — Telephone Encounter (Signed)
Called patient. Left a voicemail to call us back.  

## 2021-12-15 NOTE — Telephone Encounter (Signed)
Med Check appt is scheduled in October.

## 2022-01-23 NOTE — Progress Notes (Unsigned)
No chief complaint on file.   HPI: Patient  Jonathan [name]  41 y.o. comes in today for [place] visit  med check   Health Maintenance  Topic Date Due   HIV Screening  Never done   COVID-19 Vaccine (4 - Pfizer risk series) 03/06/2020   INFLUENZA VACCINE  10/25/2021   TETANUS/TDAP  12/26/2029   Hepatitis C Screening  Completed   HPV VACCINES  Aged Out   Health Maintenance Review LIFESTYLE:  Exercise:   Tobacco/ETS: Alcohol:  Sugar beverages: Sleep: Drug use: no HH of  Work:    ROS:  GEN/ HEENT: No fever, significant weight changes sweats headaches vision problems hearing changes, CV/ PULM; No chest pain shortness of breath cough, syncope,edema  change in exercise tolerance. GI /GU: No adominal pain, vomiting, change in bowel habits. No blood in the stool. No significant GU symptoms. SKIN/HEME: ,no acute skin rashes suspicious lesions or bleeding. No lymphadenopathy, nodules, masses.  NEURO/ PSYCH:  No neurologic signs such as weakness numbness. No depression anxiety. IMM/ Allergy: No unusual infections.  Allergy .   REST of 12 system review negative except as per HPI   Past Medical History:  Diagnosis Date   ADD (attention deficit disorder)    Allergy    Asthma    hx consult Dr Annamaria Boots.   Blood in stool 11/13/2014   painless  ? not mixed in  and gi opinoin  r no chnag ein bowel habits     IBS (irritable bowel syndrome)     Past Surgical History:  Procedure Laterality Date   UPPER GASTROINTESTINAL ENDOSCOPY     eval by GI neg   WRIST SURGERY     right- March 2016    Family History  Problem Relation Age of Onset   Hyperlipidemia Father    Colon cancer Neg Hx    Esophageal cancer Neg Hx    Rectal cancer Neg Hx    Stomach cancer Neg Hx     Social History   Socioeconomic History   Marital status: Married    Spouse name: Not on file   Number of children: Not on file   Years of education: Not on file   Highest education level: Bachelor's  degree (e.g., BA, AB, BS)  Occupational History   Not on file  Tobacco Use   Smoking status: Never   Smokeless tobacco: Former    Types: Chew    Quit date: 12/04/2014  Vaping Use   Vaping Use: Never used  Substance and Sexual Activity   Alcohol use: Yes    Alcohol/week: 0.0 standard drinks of alcohol    Comment: socially   Drug use: No   Sexual activity: Not on file  Other Topics Concern   Not on file  Social History Narrative   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       Regular exercise- yes runner 2 x per week  Lift 2 x per week    No ets.    workign from home during covid pandemic rstrictions   Social Determinants of Health   Financial Resource Strain: Low Risk  (01/23/2022)   Overall Financial Resource Strain (CARDIA)    Difficulty of Paying Living Expenses: Not hard at all  Food Insecurity: No Food Insecurity (01/23/2022)   Hunger Vital Sign  Worried About Charity fundraiser in the Last Year: Never true    [place] in the Last Year: Never true  Transportation Needs: No Transportation Needs (01/23/2022)   PRAPARE - Hydrologist (Medical): No    Lack of Transportation (Non-Medical): No  Physical Activity: Sufficiently Active (01/23/2022)   Exercise Vital Sign    Days of Exercise per Week: 5 days    Minutes of Exercise per Session: 40 min  Stress: No Stress Concern Present (01/23/2022)   [place]    Feeling of Stress : Only a little  Social Connections: Moderately Isolated (01/23/2022)   Social Connection and Isolation Panel [NHANES]    Frequency of Communication with Friends and Family: Once a week    Frequency of Social Gatherings with Friends and Family: Once a week    Attends Religious Services: 1 to 4 times per year    Active Member of [employer] or  Organizations: No    Attends Music therapist: Not on file    Marital Status: Married    Outpatient Medications Prior to Visit  Medication Sig Dispense Refill   amphetamine-dextroamphetamine (ADDERALL XR) 30 MG 24 hr capsule Take 1 capsule (30 mg total) by mouth daily. Fill last 30 capsule 0   amphetamine-dextroamphetamine (ADDERALL XR) 30 MG 24 hr capsule Take 1 capsule (30 mg total) by mouth daily. Fill first 30 capsule 0   amphetamine-dextroamphetamine (ADDERALL XR) 30 MG 24 hr capsule Take 1 capsule (30 mg total) by mouth every morning. Fill second need  appt before further refills 30 capsule 0   azelastine (OPTIVAR) 0.05 % ophthalmic solution Place 1 drop into both eyes 2 (two) times daily. 6 mL 1   No facility-administered medications prior to visit.     EXAM:  There were no vitals taken for this visit.  There is no height or weight on file to calculate BMI. Wt Readings from Last 3 Encounters:  05/24/21 162 lb (73.5 kg)  05/04/21 165 lb (74.8 kg)  11/24/20 172 lb 9.6 oz (78.3 kg)    Physical Exam: Vital signs reviewed WRU:EAVW is a well-developed well-nourished alert cooperative    who appearsr stated age in no acute distress.  HEENT: normocephalic atraumatic , Eyes: PERRL EOM's full, conjunctiva clear, Nares: paten,t no deformity discharge or tenderness., Ears: no deformity EAC's clear TMs with normal landmarks. Mouth: clear OP, no lesions, edema.  Moist mucous membranes. Dentition in adequate repair. NECK: supple without masses, thyromegaly or bruits. CHEST/PULM:  Clear to auscultation and percussion breath sounds equal no wheeze , rales or rhonchi. No chest wall deformities or tenderness. Breast: normal by inspection . No dimpling, discharge, masses, tenderness or discharge . CV: PMI is nondisplaced, S1 S2 no gallops, murmurs, rubs. Peripheral pulses are full without delay.No JVD .  ABDOMEN: Bowel sounds normal nontender  No guard or rebound, no hepato  splenomegal no CVA tenderness.  No hernia. Extremtities:  No clubbing cyanosis or edema, no acute joint swelling or redness no focal atrophy NEURO:  Oriented x3, cranial nerves 3-12 appear to be intact, no obvious focal weakness,gait within normal limits no abnormal reflexes or asymmetrical SKIN: No acute rashes normal turgor, color, no bruising or petechiae. PSYCH: Oriented, good eye contact, no obvious depression anxiety, cognition and judgment appear normal. LN: no cervical axillary inguinal adenopathy  Lab Results  Component Value Date   WBC 4.6 05/04/2021   HGB 14.8  05/04/2021   HCT 43.3 05/04/2021   PLT 219.0 05/04/2021   GLUCOSE 89 05/04/2021   CHOL 208 (H) 06/07/2021   TRIG 135.0 06/07/2021   HDL 58.50 06/07/2021   LDLCALC 122 (H) 06/07/2021   ALT 34 06/07/2021   AST 30 06/07/2021   NA 138 05/04/2021   K 4.1 05/04/2021   CL 99 05/04/2021   CREATININE 0.90 05/04/2021   BUN 12 05/04/2021   CO2 29 05/04/2021   TSH 2.30 05/04/2021   HGBA1C 5.2 05/28/2017    BP Readings from Last 3 Encounters:  05/24/21 124/76  05/04/21 140/90  11/24/20 134/88    Labplanreviewed with patient   ASSESSMENT AND PLAN:  Discussed the following assessment and plan:  No diagnosis found. No follow-ups on file.  Patient Care Team: Burnis Medin, MD as PCP - General Gerda Diss, DO as Consulting Physician (Sports Medicine) There are no Patient Instructions on file for this visit.  Standley Brooking. Theodore Rahrig M.D.

## 2022-01-24 ENCOUNTER — Ambulatory Visit: Payer: Commercial Managed Care - PPO | Admitting: Internal Medicine

## 2022-01-24 ENCOUNTER — Encounter: Payer: Self-pay | Admitting: Internal Medicine

## 2022-01-24 VITALS — BP 160/92 | HR 74 | Temp 97.7°F | Ht 69.0 in | Wt 157.8 lb

## 2022-01-24 DIAGNOSIS — Z23 Encounter for immunization: Secondary | ICD-10-CM | POA: Diagnosis not present

## 2022-01-24 DIAGNOSIS — F988 Other specified behavioral and emotional disorders with onset usually occurring in childhood and adolescence: Secondary | ICD-10-CM | POA: Diagnosis not present

## 2022-01-24 DIAGNOSIS — Z0000 Encounter for general adult medical examination without abnormal findings: Secondary | ICD-10-CM

## 2022-01-24 DIAGNOSIS — R030 Elevated blood-pressure reading, without diagnosis of hypertension: Secondary | ICD-10-CM | POA: Diagnosis not present

## 2022-01-24 DIAGNOSIS — Z79899 Other long term (current) drug therapy: Secondary | ICD-10-CM

## 2022-01-24 DIAGNOSIS — E785 Hyperlipidemia, unspecified: Secondary | ICD-10-CM

## 2022-01-24 MED ORDER — AMPHETAMINE-DEXTROAMPHET ER 30 MG PO CP24: 30.0000 mg | ORAL_CAPSULE | ORAL | 0 refills | Status: DC

## 2022-01-24 MED ORDER — AMPHETAMINE-DEXTROAMPHET ER 30 MG PO CP24: 30.0000 mg | ORAL_CAPSULE | Freq: Every day | ORAL | 0 refills | Status: DC

## 2022-01-24 NOTE — Patient Instructions (Signed)
Good to see you today  Bp reading was elevated.  Need to document that in range out side of clinic 130/80 and below average.  Please bring your blood pressure cuff to next appointment Take blood pressure readings twice a day for 5-7 days and record .     Take 2 -3 readings at each sitting .   Can send in readings  by My Chart.     Before checking your blood pressure make sure: You are seated and quite for 5 min before checking Feet are flat on the floor Siting in chair with your back supported straight up and down Arm resting on table or arm of chair at heart level Bladder is empty You have NOT had caffeine or tobacco within the last 30 min  [email or web address]

## 2022-03-23 ENCOUNTER — Other Ambulatory Visit: Payer: Self-pay | Admitting: Internal Medicine

## 2022-03-24 MED ORDER — AMPHETAMINE-DEXTROAMPHET ER 30 MG PO CP24: 30.0000 mg | ORAL_CAPSULE | Freq: Every day | ORAL | 0 refills | Status: DC

## 2022-03-24 NOTE — Telephone Encounter (Signed)
Last refill-01/24/22--30 tabs, 2 refills Last OV-01/24/22  Next OV CPE-05/15/22

## 2022-04-18 ENCOUNTER — Encounter: Payer: Self-pay | Admitting: Internal Medicine

## 2022-05-15 ENCOUNTER — Encounter: Payer: Commercial Managed Care - PPO | Admitting: Internal Medicine

## 2022-05-23 ENCOUNTER — Other Ambulatory Visit: Payer: Self-pay | Admitting: Internal Medicine

## 2022-05-26 MED ORDER — AMPHETAMINE-DEXTROAMPHET ER 30 MG PO CP24: 30.0000 mg | ORAL_CAPSULE | Freq: Every day | ORAL | 0 refills | Status: DC

## 2022-05-26 NOTE — Telephone Encounter (Signed)
Last refill-03/24/22--30 capsules, 0 refills Last OV-01/24/22  *Next OV CPE-06/22/22

## 2022-06-22 ENCOUNTER — Encounter: Payer: Self-pay | Admitting: Internal Medicine

## 2022-06-22 ENCOUNTER — Ambulatory Visit (INDEPENDENT_AMBULATORY_CARE_PROVIDER_SITE_OTHER): Payer: Commercial Managed Care - PPO | Admitting: Internal Medicine

## 2022-06-22 VITALS — BP 144/82 | HR 82 | Temp 97.5°F | Ht 69.25 in | Wt 164.8 lb

## 2022-06-22 DIAGNOSIS — Z0000 Encounter for general adult medical examination without abnormal findings: Secondary | ICD-10-CM

## 2022-06-22 DIAGNOSIS — F988 Other specified behavioral and emotional disorders with onset usually occurring in childhood and adolescence: Secondary | ICD-10-CM | POA: Diagnosis not present

## 2022-06-22 DIAGNOSIS — R030 Elevated blood-pressure reading, without diagnosis of hypertension: Secondary | ICD-10-CM

## 2022-06-22 DIAGNOSIS — E7800 Pure hypercholesterolemia, unspecified: Secondary | ICD-10-CM | POA: Diagnosis not present

## 2022-06-22 DIAGNOSIS — Z79899 Other long term (current) drug therapy: Secondary | ICD-10-CM

## 2022-06-22 LAB — CBC WITH DIFFERENTIAL/PLATELET
Basophils Absolute: 0 10*3/uL (ref 0.0–0.1)
Basophils Relative: 0.4 % (ref 0.0–3.0)
Eosinophils Absolute: 0.1 10*3/uL (ref 0.0–0.7)
Eosinophils Relative: 1.2 % (ref 0.0–5.0)
HCT: 44.5 % (ref 39.0–52.0)
Hemoglobin: 15.6 g/dL (ref 13.0–17.0)
Lymphocytes Relative: 22.3 % (ref 12.0–46.0)
Lymphs Abs: 1 10*3/uL (ref 0.7–4.0)
MCHC: 35.1 g/dL (ref 30.0–36.0)
MCV: 94.7 fl (ref 78.0–100.0)
Monocytes Absolute: 0.5 10*3/uL (ref 0.1–1.0)
Monocytes Relative: 11.2 % (ref 3.0–12.0)
Neutro Abs: 3 10*3/uL (ref 1.4–7.7)
Neutrophils Relative %: 64.9 % (ref 43.0–77.0)
Platelets: 216 10*3/uL (ref 150.0–400.0)
RBC: 4.7 Mil/uL (ref 4.22–5.81)
RDW: 12.7 % (ref 11.5–15.5)
WBC: 4.6 10*3/uL (ref 4.0–10.5)

## 2022-06-22 LAB — COMPREHENSIVE METABOLIC PANEL
ALT: 29 U/L (ref 0–53)
AST: 29 U/L (ref 0–37)
Albumin: 4.6 g/dL (ref 3.5–5.2)
Alkaline Phosphatase: 70 U/L (ref 39–117)
BUN: 11 mg/dL (ref 6–23)
CO2: 28 mEq/L (ref 19–32)
Calcium: 9.9 mg/dL (ref 8.4–10.5)
Chloride: 101 mEq/L (ref 96–112)
Creatinine, Ser: 0.89 mg/dL (ref 0.40–1.50)
GFR: 105.48 mL/min (ref >60.00)
Glucose, Bld: 79 mg/dL (ref 70–99)
Potassium: 4 mEq/L (ref 3.5–5.1)
Sodium: 138 mEq/L (ref 135–145)
Total Bilirubin: 0.7 mg/dL (ref 0.2–1.2)
Total Protein: 7.6 g/dL (ref 6.0–8.3)

## 2022-06-22 LAB — LIPID PANEL
Cholesterol: 225 mg/dL — ABNORMAL HIGH (ref 0–200)
HDL: 69.1 mg/dL (ref >39.00)
LDL Cholesterol: 138 mg/dL — ABNORMAL HIGH (ref 0–99)
NonHDL: 155.57
Total CHOL/HDL Ratio: 3
Triglycerides: 86 mg/dL (ref 0.0–149.0)
VLDL: 17.2 mg/dL (ref 0.0–40.0)

## 2022-06-22 LAB — TSH: TSH: 2.52 u[IU]/mL (ref 0.35–5.50)

## 2022-06-22 NOTE — Patient Instructions (Addendum)
Good to see y ou today . Lab today   Take blood pressure readings twice a day for 5-7  days and then periodically .To ensure below 140/90   .Send in readings    as we discussed .  Contact when need med refill.

## 2022-06-22 NOTE — Progress Notes (Signed)
Chief Complaint  Patient presents with   Annual Exam    HPI: Patient  Jonathan [name]  42 y.o. comes in today for Preventive Health Care visit  and med check Adhd : daily helps   takes daily  no sig se   wife notes  Training for 1/2 marathon  no s of concern Father may have had ht  but no hx of  premature vascular events   Health Maintenance  Topic Date Due   COVID-19 Vaccine (4 - 2023-24 season) 07/08/2022 (Originally 11/25/2021)   HIV Screening  06/22/2023 (Originally 08/31/1995)   DTaP/Tdap/Td (3 - Td or Tdap) 12/26/2029   INFLUENZA VACCINE  Completed   Hepatitis C Screening  Completed   HPV VACCINES  Aged Out   Health Maintenance Review LIFESTYLE:  Exercise:   1/2 marathon training . Tobacco/ETS: no Alcohol:  some ocass Sugar beverages: coke zero  Sleep:7-8  Drug use: no HH of  2.5  step daughter  53  part time Work: from home Fridays  [employer]   ROS:  GEN/ HEENT: No fever, significant weight changes sweats headaches vision problems hearing changes, CV/ PULM; No chest pain shortness of breath cough, syncope,edema  change in exercise tolerance. GI /GU: No adominal pain, vomiting, change in bowel habits. No blood in the stool. No significant GU symptoms. SKIN/HEME: ,no acute skin rashes suspicious lesions or bleeding. No lymphadenopathy, nodules, masses.  NEURO/ PSYCH:  No neurologic signs such as weakness numbness. No depression anxiety. IMM/ Allergy: No unusual infections.  Allergy .   REST of 12 system review negative except as per HPI   Past Medical History:  Diagnosis Date   ADD (attention deficit disorder)    Allergy    Asthma    hx consult Dr Annamaria Boots.   Blood in stool 11/13/2014   painless  ? not mixed in  and gi opinoin  r no chnag ein bowel habits     IBS (irritable bowel syndrome)     Past Surgical History:  Procedure Laterality Date   UPPER GASTROINTESTINAL ENDOSCOPY     eval by GI neg   WRIST SURGERY     right- March 2016    Family  History  Problem Relation Age of Onset   Hyperlipidemia Father    Colon cancer Neg Hx    Esophageal cancer Neg Hx    Rectal cancer Neg Hx    Stomach cancer Neg Hx     Social History   Socioeconomic History   Marital status: Married    Spouse name: Not on file   Number of children: Not on file   Years of education: Not on file   Highest education level: Bachelor's degree (e.g., BA, AB, BS)  Occupational History   Not on file  Tobacco Use   Smoking status: Never   Smokeless tobacco: Former    Types: Chew    Quit date: 12/04/2014  Vaping Use   Vaping Use: Never used  Substance and Sexual Activity   Alcohol use: Yes    Alcohol/week: 0.0 standard drinks of alcohol    Comment: socially   Drug use: No   Sexual activity: Not on file  Other Topics Concern   Not on file  Social History Narrative   Occupation: 40 hours per week ( prev [employer])  Now works  for [employer] M-Fr    Single engaged    [facility] of 2 pet dog  No ets.  Regular exercise- yes runner 2 x per week  Lift 2 x per week    No ets.    workign from home during covid pandemic rstrictions   Social Determinants of Health   Financial Resource Strain: Low Risk  (01/23/2022)   Overall Financial Resource Strain (CARDIA)    Difficulty of Paying Living Expenses: Not hard at all  Food Insecurity: No Food Insecurity (01/23/2022)   Hunger Vital Sign    Worried About Running Out of Food in the Last Year: Never true    Ran Out of Food in the Last Year: Never true  Transportation Needs: No Transportation Needs (01/23/2022)   PRAPARE - Hydrologist (Medical): No    Lack of Transportation (Non-Medical): No  Physical Activity: Sufficiently Active (01/23/2022)   Exercise Vital Sign    Days of Exercise per Week: 5 days    Minutes of Exercise per Session: 40 min  Stress: No Stress Concern Present (01/23/2022)   [place]    Feeling of Stress : Only a little  Social Connections: Moderately Isolated (01/23/2022)   Social Connection and Isolation Panel [NHANES]    Frequency of Communication with Friends and Family: Once a week    Frequency of Social Gatherings with Friends and Family: Once a week    Attends Religious Services: 1 to 4 times per year    Active Member of [employer] or Organizations: No    Attends Music therapist: Not on file    Marital Status: Married    Outpatient Medications Prior to Visit  Medication Sig Dispense Refill   amphetamine-dextroamphetamine (ADDERALL XR) 30 MG 24 hr capsule Take 1 capsule (30 mg total) by mouth every morning. Fill second 30 capsule 0   amphetamine-dextroamphetamine (ADDERALL XR) 30 MG 24 hr capsule Take 1 capsule (30 mg total) by mouth daily. Fill last 30 capsule 0   amphetamine-dextroamphetamine (ADDERALL XR) 30 MG 24 hr capsule Take 1 capsule (30 mg total) by mouth daily. Fill first 30 capsule 0   azelastine (OPTIVAR) 0.05 % ophthalmic solution Place 1 drop into both eyes 2 (two) times daily. 6 mL 1   No facility-administered medications prior to visit.     EXAM:  BP (!) 144/82 (BP Location: Right Arm, Cuff Size: Large)   Pulse 82   Temp (!) 97.5 F (36.4 C) (Oral)   Ht 5' 9.25" (1.759 m)   Wt 164 lb 12.8 oz (74.8 kg)   SpO2 96%   BMI 24.16 kg/m   Body mass index is 24.16 kg/m. Wt Readings from Last 3 Encounters:  06/22/22 164 lb 12.8 oz (74.8 kg)  01/24/22 157 lb 12.8 oz (71.6 kg)  05/24/21 162 lb (73.5 kg)    Physical Exam: Vital signs reviewed [id number] is a well-developed well-nourished alert cooperative    who appearsr stated age in no acute distress.  HEENT: normocephalic atraumatic , Eyes: PERRL EOM's full, conjunctiva clear, Nares: paten,t no deformity discharge or tenderness., Ears: no deformity EAC's clear TMs with normal landmarks. Mouth: clear OP, no lesions, edema.  Moist mucous membranes. Dentition in  adequate repair. NECK: supple without masses, thyromegaly or bruits. CHEST/PULM:  Clear to auscultation and percussion breath sounds equal no wheeze , rales or rhonchi. No chest wall deformities or tenderness.. CV: PMI is nondisplaced, S1 S2 no gallops, murmurs, rubs. Peripheral pulses are full without delay.No JVD .  ABDOMEN: Bowel sounds normal nontender  No guard  or rebound, no hepato splenomegal no CVA tenderness.   Extremtities:  No clubbing cyanosis or edema, no acute joint swelling or redness no focal atrophy NEURO:  Oriented x3, cranial nerves 3-12 appear to be intact, no obvious focal weakness,gait within normal limits no abnormal reflexes or asymmetrical SKIN: No acute rashes normal turgor, color, no bruising or petechiae. PSYCH: Oriented, good eye contact, no obvious depression anxiety, cognition and judgment appear normal. LN: no cervical axillary inguinal adenopathy  Lab Results  Component Value Date   WBC 4.6 06/22/2022   HGB 15.6 06/22/2022   HCT 44.5 06/22/2022   PLT 216.0 06/22/2022   GLUCOSE 79 06/22/2022   CHOL 225 (H) 06/22/2022   TRIG 86.0 06/22/2022   HDL 69.10 06/22/2022   LDLCALC 138 (H) 06/22/2022   ALT 29 06/22/2022   AST 29 06/22/2022   NA 138 06/22/2022   K 4.0 06/22/2022   CL 101 06/22/2022   CREATININE 0.89 06/22/2022   BUN 11 06/22/2022   CO2 28 06/22/2022   TSH 2.52 06/22/2022   HGBA1C 5.2 05/28/2017    BP Readings from Last 3 Encounters:  06/22/22 (!) 144/82  01/24/22 (!) 160/92  05/24/21 124/76    Lab plan results reviewed with patient  And documentation of bp baseline  ASSESSMENT AND PLAN:  Discussed the following assessment and plan:    ICD-10-CM   1. Visit for preventive health examination  Z00.00 CBC with Differential/Platelet    Comprehensive metabolic panel    Lipid panel    TSH    Lipoprotein A (LPA)    2. Medication management  Z79.899 CBC with Differential/Platelet    Comprehensive metabolic panel    Lipid panel     TSH    Lipoprotein A (LPA)    3. Attention deficit disorder, unspecified hyperactivity presence  F98.8 CBC with Differential/Platelet    Comprehensive metabolic panel    Lipid panel    TSH    Lipoprotein A (LPA)    4. Elevated LDL cholesterol level  E78.00 CBC with Differential/Platelet    Comprehensive metabolic panel    Lipid panel    TSH    Lipoprotein A (LPA)    5. Elevated blood pressure reading  R03.0 CBC with Differential/Platelet    Comprehensive metabolic panel    Lipid panel    TSH    Lipoprotein A (LPA)   document home readings   and fu as appropriate   and 6 mos med check     Return in about 6 months (around 12/23/2022) for depending on results and BP readings .  Patient Care Team: Burnis Medin, MD as PCP - General Gerda Diss, DO as Consulting Physician (Sports Medicine) Patient Instructions  Good to see y ou today . Lab today   Take blood pressure readings twice a day for 5-7  days and then periodically .To ensure below 140/90   .Send in readings    as we discussed .  Contact when need med refill.     Standley Brooking. Audon Heymann M.D.

## 2022-06-25 NOTE — Assessment & Plan Note (Signed)
Reported ok at home but no recent check  Advised document 3-5 d bid  confirmation of readings at goal

## 2022-06-25 NOTE — Assessment & Plan Note (Signed)
Continue medication  Risk benefit of medication discussed.  6 mos med check

## 2022-06-26 NOTE — Progress Notes (Signed)
Cholesterol hgiher than last year  not alarming but attend to diet intake since you habe plenty of exercise .  Lipo A screen result is pending.  .The 10-year ASCVD risk score (Arnett DK, et al., 2019) is: 1.4%   Values used to calculate the score:     Age: 42 years     Sex: Male     Is Non-Hispanic African American: No     Diabetic: No     Tobacco smoker: No     Systolic Blood Pressure: 123456 mmHg     Is BP treated: No     HDL Cholesterol: 69.1 mg/dL     Total Cholesterol: 225 mg/dL

## 2022-06-27 LAB — LIPOPROTEIN A (LPA): Lipoprotein (a): 266 nmol/L — ABNORMAL HIGH (ref <75)

## 2022-07-03 NOTE — Progress Notes (Signed)
So lipo protein A is very high which is an independent risk of  future vascular disease . Prob hereditary)   Consider doing a ct calcium score to check  if calcium build up in arteries  and if so  consider add cholesterol lowering medication.   At any time  can get a consult from cardiology .  Or make visit virtual  if wish to discuss

## 2022-07-04 ENCOUNTER — Encounter: Payer: Self-pay | Admitting: Internal Medicine

## 2022-07-17 ENCOUNTER — Other Ambulatory Visit: Payer: Self-pay | Admitting: Internal Medicine

## 2022-07-19 MED ORDER — AMPHETAMINE-DEXTROAMPHET ER 30 MG PO CP24: 30.0000 mg | ORAL_CAPSULE | ORAL | 0 refills | Status: DC

## 2022-07-26 ENCOUNTER — Encounter: Payer: Self-pay | Admitting: Internal Medicine

## 2022-07-26 ENCOUNTER — Telehealth (INDEPENDENT_AMBULATORY_CARE_PROVIDER_SITE_OTHER): Payer: Commercial Managed Care - PPO | Admitting: Internal Medicine

## 2022-07-26 VITALS — Ht 69.25 in | Wt 164.0 lb

## 2022-07-26 DIAGNOSIS — R030 Elevated blood-pressure reading, without diagnosis of hypertension: Secondary | ICD-10-CM | POA: Diagnosis not present

## 2022-07-26 DIAGNOSIS — E7841 Elevated Lipoprotein(a): Secondary | ICD-10-CM

## 2022-07-26 DIAGNOSIS — Z79899 Other long term (current) drug therapy: Secondary | ICD-10-CM

## 2022-07-26 DIAGNOSIS — E7800 Pure hypercholesterolemia, unspecified: Secondary | ICD-10-CM | POA: Diagnosis not present

## 2022-07-26 NOTE — Progress Notes (Signed)
Virtual Visit via Video Note  I connected with Jonathan [name] on 07/26/22 at 11:30 AM EDT by a video enabled telemedicine application and verified that I am speaking with the correct person using two identifiers. Location patient: home Location provider:work office Persons participating in the virtual visit: patient, provider  Patient aware  of the limitations of evaluation and management by telemedicine and  availability of in person appointments. and agreed to proceed.   HPI: Jonathan [name] presents for video visit to discuss findings  elevated lipoa  and lipids  No cv sx and is a runner  reg exercise. BP log  129-130 ocass 140 range   had a 150 that came down on repeat reading.   ROS: See pertinent positives and negatives per HPI.  Past Medical History:  Diagnosis Date   ADD (attention deficit disorder)    Allergy    Asthma    hx consult Dr Maple Hudson.   Blood in stool 11/13/2014   painless  ? not mixed in  and gi opinoin  r no chnag ein bowel habits     IBS (irritable bowel syndrome)     Past Surgical History:  Procedure Laterality Date   UPPER GASTROINTESTINAL ENDOSCOPY     eval by GI neg   WRIST SURGERY     right- March 2016    Family History  Problem Relation Age of Onset   Hyperlipidemia Father    Colon cancer Neg Hx    Esophageal cancer Neg Hx    Rectal cancer Neg Hx    Stomach cancer Neg Hx     Social History   Tobacco Use   Smoking status: Never   Smokeless tobacco: Former    Types: Chew    Quit date: 12/04/2014  Vaping Use   Vaping Use: Never used  Substance Use Topics   Alcohol use: Yes    Alcohol/week: 0.0 standard drinks of alcohol    Comment: socially   Drug use: No      Current Outpatient Medications:    amphetamine-dextroamphetamine (ADDERALL XR) 30 MG 24 hr capsule, Take 1 capsule (30 mg total) by mouth daily. Fill last, Disp: 30 capsule, Rfl: 0   amphetamine-dextroamphetamine (ADDERALL XR) 30 MG 24 hr capsule, Take 1 capsule (30 mg total)  by mouth daily. Fill first, Disp: 30 capsule, Rfl: 0   amphetamine-dextroamphetamine (ADDERALL XR) 30 MG 24 hr capsule, Take 1 capsule (30 mg total) by mouth every morning. Fill second, Disp: 30 capsule, Rfl: 0   azelastine (OPTIVAR) 0.05 % ophthalmic solution, Place 1 drop into both eyes 2 (two) times daily., Disp: 6 mL, Rfl: 1  EXAM: BP Readings from Last 3 Encounters:  06/22/22 (!) 144/82  01/24/22 (!) 160/92  05/24/21 124/76    VITALS per patient if applicable:  GENERAL: alert, oriented, appears well and in no acute distress  HEENT: atraumatic, conjunttiva clear, no obvious abnormalities on inspection of external nose and ears  NECK: normal movements of the head and neck  LUNGS: on inspection no signs of respiratory distress, breathing rate appears normal, no obvious gross SOB, gasping or wheezing  CV: no obvious cyanosis  MS: moves all visible extremities without noticeable abnormality  PSYCH/NEURO: pleasant and cooperative, no obvious depression or anxiety, speech and thought processing grossly intact Lab Results  Component Value Date   WBC 4.6 06/22/2022   HGB 15.6 06/22/2022   HCT 44.5 06/22/2022   PLT 216.0 06/22/2022   GLUCOSE 79 06/22/2022   CHOL 225 (H)  06/22/2022   TRIG 86.0 06/22/2022   HDL 69.10 06/22/2022   LDLCALC 138 (H) 06/22/2022   ALT 29 06/22/2022   AST 29 06/22/2022   NA 138 06/22/2022   K 4.0 06/22/2022   CL 101 06/22/2022   CREATININE 0.89 06/22/2022   BUN 11 06/22/2022   CO2 28 06/22/2022   TSH 2.52 06/22/2022   HGBA1C 5.2 05/28/2017  Lipo a 266  ASSESSMENT AND PLAN:  Discussed the following assessment and plan:    ICD-10-CM   1. Elevated lipoprotein A level  E78.41 CT CARDIAC SCORING (SELF PAY ONLY)    2. Elevated LDL cholesterol level  E78.00 CT CARDIAC SCORING (SELF PAY ONLY)    3. Medication management  Z79.899     4. Elevated blood pressure reading  R03.0    varies  129 130s ocass 140  plan dash eating continue exercise  and  fu  add med as indicated     Plan: trial dash eating and continue exercise  sodium restricting and if bp not averaging at goal 3-6 months then can add medication . If 160 and above  would add med sooner  Plan ct calcium score  disc implications  consider adding statin med in addition to lsiand any aggressive risk reduction  Always option to consults with cardiology.  In future   Counseled.   Expectant management and discussion of plan and treatment with opportunity to ask questions and all were answered. The patient agreed with the plan and demonstrated an understanding of the instructions.   Advised to call back or seek an in-person evaluation if worsening  or having  further concerns  in interim. Return for depending on ct score results  and bp 3-6 months .   [name], MD

## 2022-08-10 ENCOUNTER — Encounter: Payer: Self-pay | Admitting: Internal Medicine

## 2022-08-14 ENCOUNTER — Other Ambulatory Visit: Payer: Self-pay | Admitting: Internal Medicine

## 2022-08-15 MED ORDER — AMPHETAMINE-DEXTROAMPHET ER 30 MG PO CP24: 30.0000 mg | ORAL_CAPSULE | ORAL | 0 refills | Status: DC

## 2022-09-10 ENCOUNTER — Other Ambulatory Visit: Payer: Self-pay | Admitting: Internal Medicine

## 2022-09-11 MED ORDER — AMPHETAMINE-DEXTROAMPHET ER 30 MG PO CP24: 30.0000 mg | ORAL_CAPSULE | ORAL | 0 refills | Status: DC

## 2022-09-19 ENCOUNTER — Ambulatory Visit (HOSPITAL_BASED_OUTPATIENT_CLINIC_OR_DEPARTMENT_OTHER)
Admission: RE | Admit: 2022-09-19 | Discharge: 2022-09-19 | Disposition: A | Discharge status: Home / Self Care | Payer: Commercial Managed Care - PPO | Source: Ambulatory Visit | Attending: Internal Medicine | Admitting: Internal Medicine

## 2022-09-19 DIAGNOSIS — E7841 Elevated Lipoprotein(a): Secondary | ICD-10-CM | POA: Insufficient documentation

## 2022-09-19 DIAGNOSIS — E7800 Pure hypercholesterolemia, unspecified: Secondary | ICD-10-CM | POA: Insufficient documentation

## 2022-09-19 NOTE — Progress Notes (Signed)
Calcium score is zero and is favorable . Although the high lipo a is a separate risk factor  .  Can choose to repeat scan in 5 years can  consider taking statin anyway and  or  talk with cardiology about  options

## 2022-09-26 ENCOUNTER — Encounter: Payer: Self-pay | Admitting: Internal Medicine

## 2022-09-26 ENCOUNTER — Telehealth: Payer: Commercial Managed Care - PPO | Admitting: Internal Medicine

## 2022-09-26 VITALS — BP 133/80 | HR 75 | Ht 69.5 in | Wt 163.0 lb

## 2022-09-26 DIAGNOSIS — E7841 Elevated Lipoprotein(a): Secondary | ICD-10-CM

## 2022-09-26 DIAGNOSIS — E7800 Pure hypercholesterolemia, unspecified: Secondary | ICD-10-CM

## 2022-09-26 NOTE — Progress Notes (Signed)
Virtual Visit via Video Note  I connected with Jonathan [name] on 10/16/22 at  4:00 PM EDT by a video enabled telemedicine application and verified that I am speaking with the correct person using two identifiers. 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 and agreed to proceed.   HPI: Jonathan [name] presents for video visit  regarding  testing cv risk assessment. Since last visit no change health  bp at dentist offic `133/80 is decreasing sodium in  diet.  No cv sx continues healthy exercise .  ROS: See pertinent positives and negatives per HPI.  Past Medical History:  Diagnosis Date   ADD (attention deficit disorder)    Allergy    Asthma    hx consult Dr Jonathan [name].   Blood in stool 11/13/2014   painless  ? not mixed in  and gi opinoin  r no chnag ein bowel habits     IBS (irritable bowel syndrome)     Past Surgical History:  Procedure Laterality Date   UPPER GASTROINTESTINAL ENDOSCOPY     eval by GI neg   WRIST SURGERY     right- March 2016    Family History  Problem Relation Age of Onset   Hyperlipidemia Father    Colon cancer Neg Hx    Esophageal cancer Neg Hx    Rectal cancer Neg Hx    Stomach cancer Neg Hx     Social History   Tobacco Use   Smoking status: Never   Smokeless tobacco: Former    Types: Chew    Quit date: 12/04/2014  Vaping Use   Vaping status: Never Used  Substance Use Topics   Alcohol use: Yes    Alcohol/week: 0.0 standard drinks of alcohol    Comment: socially   Drug use: No      Current Outpatient Medications:    amphetamine-dextroamphetamine (ADDERALL XR) 30 MG 24 hr capsule, Take 1 capsule (30 mg total) by mouth daily. Fill last, Disp: 30 capsule, Rfl: 0   amphetamine-dextroamphetamine (ADDERALL XR) 30 MG 24 hr capsule, Take 1 capsule  (30 mg total) by mouth every morning. Fill second, Disp: 30 capsule, Rfl: 0   amphetamine-dextroamphetamine (ADDERALL XR) 30 MG 24 hr capsule, Take 1 capsule (30 mg total) by mouth daily. Fill first, Disp: 30 capsule, Rfl: 0   azelastine (OPTIVAR) 0.05 % ophthalmic solution, Place 1 drop into both eyes 2 (two) times daily. (Patient not taking: Reported on 09/26/2022), Disp: 6 mL, Rfl: 1  EXAM: BP Readings from Last 3 Encounters:  09/26/22 133/80  06/22/22 (!) 144/82  01/24/22 (!) 160/92    VITALS per patient if applicable:  GENERAL: alert, oriented, appears well and in no acute distress  HEENT: atraumatic, conjunttiva clear, no obvious abnormalities on inspection of external nose and ears  NECK: normal movements of the head and neck  LUNGS: on inspection no signs of respiratory distress, breathing rate appears normal, no obvious gross SOB, gasping or wheezing  CV: no obvious cyanosis   PSYCH/NEURO: pleasant and cooperative, no obvious depression or anxiety, speech and thought processing grossly intact Lab Results  Component Value Date   WBC 4.6 06/22/2022   HGB 15.6 06/22/2022   HCT 44.5 06/22/2022   PLT 216.0 06/22/2022   GLUCOSE 79 06/22/2022   CHOL 225 (H) 06/22/2022   TRIG 86.0  06/22/2022   HDL 69.10 06/22/2022   LDLCALC 138 (H) 06/22/2022   ALT 29 06/22/2022   AST 29 06/22/2022   NA 138 06/22/2022   K 4.0 06/22/2022   CL 101 06/22/2022   CREATININE 0.89 06/22/2022   BUN 11 06/22/2022   CO2 28 06/22/2022   TSH 2.52 06/22/2022   HGBA1C 5.2 05/28/2017   The 10-year ASCVD risk score (Arnett DK, et al., 2019) is: 1.4%   Values used to calculate the score:     Age: 71 years     Sex: Male     Is Non-Hispanic African American: No     Diabetic: No     Tobacco smoker: No     Systolic Blood Pressure: 133 mmHg     Is BP treated: No     HDL Cholesterol: 69.1 mg/dL     Total Cholesterol: 225 mg/dL Ct calcium  score is 0  ASSESSMENT AND PLAN:  Discussed the following  assessment and plan:    ICD-10-CM   1. Elevated lipoprotein A level  E78.41    ct calcium score is 0    2. Elevated LDL cholesterol level  E78.00    138, hdl 69     Elevated lipo a,  low crp  Neg 0 ct calcium score ,  MGF mi in 8s ,  father on statin medication no events reported  Counseled.  Risk benefit of  meds.  No long term studies of what to do in his situation. But lip a if elevated seems to be an independent risk factor for future events   Suggest repat ct score in 3+ years . Continue life style can consider  consult with cards    more discussion but  not sure  benefit more than risk at this time so no rush   warned if any  cv sx to get evaluated . Consider statin med but not in high risk category except the lipo a  level  Expectant management and discussion of plan and treatment with opportunity to ask questions and all were answered. The patient agreed with the plan and demonstrated an understanding of the instructions.   Advised to call back or seek an in-person evaluation if worsening  or having  further concerns  in interim. Return for when planned.    Jonathan Andreas, MD

## 2022-10-06 ENCOUNTER — Telehealth: Payer: Self-pay | Admitting: Internal Medicine

## 2022-10-06 NOTE — Telephone Encounter (Signed)
Prescription Request  10/06/2022  LOV: 06/22/2022  What is the name of the medication or equipment? amphetamine-dextroamphetamine (ADDERALL XR) 30 MG 24 hr capsule  Have you contacted your pharmacy to request a refill? Yes   Which pharmacy would you like this sent to?   [id number] [place], [place] - [address] Phone: [phone]  Fax: [phone]      * Pt informed MD is OOO today and Monday.  Patient notified that their request is being sent to the clinical staff for review and that they should receive a response within 2 business days.   Please advise at Mobile [phone] (mobile)

## 2022-10-08 MED ORDER — AMPHETAMINE-DEXTROAMPHET ER 30 MG PO CP24: 30.0000 mg | ORAL_CAPSULE | Freq: Every day | ORAL | 0 refills | Status: DC

## 2022-10-08 NOTE — Telephone Encounter (Signed)
Sent in electronically .  

## 2022-10-09 ENCOUNTER — Other Ambulatory Visit: Payer: Self-pay | Admitting: Family

## 2022-11-01 ENCOUNTER — Other Ambulatory Visit: Payer: Self-pay | Admitting: Internal Medicine

## 2022-11-02 MED ORDER — AMPHETAMINE-DEXTROAMPHET ER 30 MG PO CP24: 30.0000 mg | ORAL_CAPSULE | Freq: Every day | ORAL | 0 refills | Status: DC

## 2022-12-01 ENCOUNTER — Other Ambulatory Visit: Payer: Self-pay | Admitting: Internal Medicine

## 2022-12-01 MED ORDER — AMPHETAMINE-DEXTROAMPHET ER 30 MG PO CP24: 30.0000 mg | ORAL_CAPSULE | Freq: Every day | ORAL | 0 refills | Status: DC

## 2022-12-22 ENCOUNTER — Telehealth: Payer: Self-pay | Admitting: Internal Medicine

## 2022-12-22 NOTE — Telephone Encounter (Signed)
Prescription Request  12/22/2022  LOV: 06/22/2022  What is the name of the medication or equipment? amphetamine-dextroamphetamine (ADDERALL XR) 30 MG 24 hr capsule   Have you contacted your pharmacy to request a refill? No   Which pharmacy would you like this sent to?   [id number] [place], [place] - [address] [place] [address] Phone: [phone] Fax: [phone]    Patient notified that their request is being sent to the clinical staff for review and that they should receive a response within 2 business days.   Please advise at Mobile [phone] (mobile)

## 2022-12-23 ENCOUNTER — Other Ambulatory Visit: Payer: Self-pay | Admitting: Family

## 2022-12-23 MED ORDER — AMPHETAMINE-DEXTROAMPHET ER 30 MG PO CP24: 30.0000 mg | ORAL_CAPSULE | Freq: Every day | ORAL | 0 refills | Status: DC

## 2022-12-25 NOTE — Progress Notes (Unsigned)
No chief complaint on file.   HPI: Jonathan [name] 42 y.o. come in for Chronic disease management  ADD/ADHD   med check  Ct calcium score  0 but lipo a is elevated   ROS: See pertinent positives and negatives per HPI.  Past Medical History:  Diagnosis Date   ADD (attention deficit disorder)    Allergy    Asthma    hx consult Dr Maple Hudson.   Blood in stool 11/13/2014   painless  ? not mixed in  and gi opinoin  r no chnag ein bowel habits     IBS (irritable bowel syndrome)     Family History  Problem Relation Age of Onset   Hyperlipidemia Father    Colon cancer Neg Hx    Esophageal cancer Neg Hx    Rectal cancer Neg Hx    Stomach cancer Neg Hx     Social History   Socioeconomic History   Marital status: Married    Spouse name: Not on file   Number of children: Not on file   Years of education: Not on file   Highest education level: Bachelor's degree (e.g., BA, AB, BS)  Occupational History   Not on file  Tobacco Use   Smoking status: Never   Smokeless tobacco: Former    Types: Chew    Quit date: 12/04/2014  Vaping Use   Vaping status: Never Used  Substance and Sexual Activity   Alcohol use: Yes    Alcohol/week: 0.0 standard drinks of alcohol    Comment: socially   Drug use: No   Sexual activity: Not on file  Other Topics Concern   Not on file  Social History Narrative   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       Regular exercise- yes runner 2 x per week  Lift 2 x per week    No ets.    workign from home during covid pandemic rstrictions   Social Determinants of Health   Financial Resource Strain: Low Risk  (01/23/2022)   Overall Financial Resource Strain (CARDIA)    Difficulty of Paying Living Expenses: Not hard at all  Food Insecurity: No Food Insecurity (01/23/2022)   Hunger Vital Sign    Worried About Running Out of Food in the Last Year: Never  true    Ran Out of Food in the Last Year: Never true  Transportation Needs: No Transportation Needs (01/23/2022)   PRAPARE - Administrator, Civil Service (Medical): No    Lack of Transportation (Non-Medical): No  Physical Activity: Sufficiently Active (01/23/2022)   Exercise Vital Sign    Days of Exercise per Week: 5 days    Minutes of Exercise per Session: 40 min  Stress: No Stress Concern Present (01/23/2022)   [employer] of Occupational Health - Occupational Stress Questionnaire    Feeling of Stress : Only a little  Social Connections: Moderately Isolated (01/23/2022)   Social Connection and Isolation Panel [NHANES]    Frequency of Communication with Friends and Family: Once a week    Frequency of Social Gatherings with Friends and Family: Once a week    Attends Religious Services: 1 to 4 times per year    Active Member of [employer] or Organizations: No    Attends Banker Meetings: Not on file  Marital Status: Married    Outpatient Medications Prior to Visit  Medication Sig Dispense Refill   amphetamine-dextroamphetamine (ADDERALL XR) 30 MG 24 hr capsule Take 1 capsule (30 mg total) by mouth daily. Fill last 30 capsule 0   amphetamine-dextroamphetamine (ADDERALL XR) 30 MG 24 hr capsule Take 1 capsule (30 mg total) by mouth every morning. Fill second 30 capsule 0   amphetamine-dextroamphetamine (ADDERALL XR) 30 MG 24 hr capsule Take 1 capsule (30 mg total) by mouth daily. Fill first 30 capsule 0   azelastine (OPTIVAR) 0.05 % ophthalmic solution Place 1 drop into both eyes 2 (two) times daily. (Patient not taking: Reported on 09/26/2022) 6 mL 1   No facility-administered medications prior to visit.     EXAM:  There were no vitals taken for this visit.  There is no height or weight on file to calculate BMI.  GENERAL: vitals reviewed and listed above, alert, oriented, appears well hydrated and in no acute distress HEENT: atraumatic, conjunctiva   clear, no obvious abnormalities on inspection of external nose and ears  NECK: no obvious masses on inspection palpation  LUNGS: clear to auscultation bilaterally, no wheezes, rales or rhonchi, good air movement CV: HRRR, no clubbing cyanosis or  peripheral edema nl cap refill  MS: moves all extremities without noticeable focal  abnormality PSYCH: pleasant and cooperative, no obvious depression or anxiety Lab Results  Component Value Date   WBC 4.6 06/22/2022   HGB 15.6 06/22/2022   HCT 44.5 06/22/2022   PLT 216.0 06/22/2022   GLUCOSE 79 06/22/2022   CHOL 225 (H) 06/22/2022   TRIG 86.0 06/22/2022   HDL 69.10 06/22/2022   LDLCALC 138 (H) 06/22/2022   ALT 29 06/22/2022   AST 29 06/22/2022   NA 138 06/22/2022   K 4.0 06/22/2022   CL 101 06/22/2022   CREATININE 0.89 06/22/2022   BUN 11 06/22/2022   CO2 28 06/22/2022   TSH 2.52 06/22/2022   HGBA1C 5.2 05/28/2017   BP Readings from Last 3 Encounters:  09/26/22 133/80  06/22/22 (!) 144/82  01/24/22 (!) 160/92    ASSESSMENT AND PLAN:  Discussed the following assessment and plan:  Attention deficit disorder, unspecified hyperactivity presence  Medication management  Elevated lipoprotein A level - ct calcium score 0 2024  -Patient advised to return or notify health care team  if  new concerns arise.  There are no Patient Instructions on file for this visit.   Neta Mends. Calissa Swenor M.D.

## 2022-12-26 ENCOUNTER — Ambulatory Visit (INDEPENDENT_AMBULATORY_CARE_PROVIDER_SITE_OTHER): Payer: Commercial Managed Care - PPO | Admitting: Internal Medicine

## 2022-12-26 ENCOUNTER — Other Ambulatory Visit: Payer: Self-pay | Admitting: Internal Medicine

## 2022-12-26 ENCOUNTER — Encounter: Payer: Self-pay | Admitting: Internal Medicine

## 2022-12-26 VITALS — BP 140/90 | HR 82 | Temp 98.0°F | Ht 69.5 in | Wt 166.0 lb

## 2022-12-26 DIAGNOSIS — E7841 Elevated Lipoprotein(a): Secondary | ICD-10-CM

## 2022-12-26 DIAGNOSIS — F988 Other specified behavioral and emotional disorders with onset usually occurring in childhood and adolescence: Secondary | ICD-10-CM

## 2022-12-26 DIAGNOSIS — I10 Essential (primary) hypertension: Secondary | ICD-10-CM

## 2022-12-26 DIAGNOSIS — Z79899 Other long term (current) drug therapy: Secondary | ICD-10-CM

## 2022-12-26 DIAGNOSIS — Z23 Encounter for immunization: Secondary | ICD-10-CM | POA: Diagnosis not present

## 2022-12-26 MED ORDER — LOSARTAN POTASSIUM 25 MG PO TABS: 25.0000 mg | ORAL_TABLET | Freq: Every day | ORAL | 3 refills | Status: AC

## 2022-12-26 MED ORDER — AMPHETAMINE-DEXTROAMPHET ER 30 MG PO CP24: 30.0000 mg | ORAL_CAPSULE | Freq: Every day | ORAL | 0 refills | Status: DC

## 2022-12-26 NOTE — Patient Instructions (Signed)
Refilled the 30 xr adderall . Continue lifestyle intervention healthy eating and exercise .  Begin low dose losartan 25 mg per day  Sending bp readings after  a month . Goal is below 130/80 average  Plan  bmp chemistry panel after 3-4 weeks on medication . ( Dont have to fast make lab appt)  Otherwise  6 months cpe med check

## 2022-12-26 NOTE — Progress Notes (Signed)
Future orders bmp

## 2023-01-30 ENCOUNTER — Encounter: Payer: Self-pay | Admitting: Internal Medicine

## 2023-01-31 ENCOUNTER — Other Ambulatory Visit: Payer: Self-pay | Admitting: Internal Medicine

## 2023-01-31 DIAGNOSIS — M6289 Other specified disorders of muscle: Secondary | ICD-10-CM

## 2023-01-31 DIAGNOSIS — Z79899 Other long term (current) drug therapy: Secondary | ICD-10-CM

## 2023-01-31 NOTE — Telephone Encounter (Signed)
HI Hamilton I think unlikely that this med at this dose would cause this problem by itself .   But we should  get updated  chemistry panel to check potassium level thyroid and muscle enzyme.  Are you taking any OTC supplements ? If so stop also.  Send in infor about BP readings . I will place  lab orders in system and you get lab appt  ( dont have to fast).  If bp not too high we can always try off med to see if different but suggest we get more information first .

## 2023-01-31 NOTE — Progress Notes (Unsigned)
Future orders see   message

## 2023-02-14 ENCOUNTER — Other Ambulatory Visit: Payer: Self-pay | Admitting: Internal Medicine

## 2023-02-14 NOTE — Telephone Encounter (Signed)
Attempted to reach pt. Left a voicemail to call us back.  

## 2023-02-15 NOTE — Telephone Encounter (Signed)
Spoke to pt to follow up from [employer] from 11/6. Pt reports he has been busy and didn't get a chance to response back. Also been sick this past week, haven't been recording  his BP. Will send in BP reading first of next week.   Lab appt schedule for tomorrow.

## 2023-02-16 ENCOUNTER — Other Ambulatory Visit (INDEPENDENT_AMBULATORY_CARE_PROVIDER_SITE_OTHER): Payer: Commercial Managed Care - PPO

## 2023-02-16 DIAGNOSIS — Z79899 Other long term (current) drug therapy: Secondary | ICD-10-CM | POA: Diagnosis not present

## 2023-02-16 DIAGNOSIS — M6289 Other specified disorders of muscle: Secondary | ICD-10-CM

## 2023-02-16 LAB — BASIC METABOLIC PANEL
BUN: 15 mg/dL (ref 6–23)
CO2: 33 meq/L — ABNORMAL HIGH (ref 19–32)
Calcium: 9.7 mg/dL (ref 8.4–10.5)
Chloride: 101 meq/L (ref 96–112)
Creatinine, Ser: 0.9 mg/dL (ref 0.40–1.50)
GFR: 104.64 mL/min (ref >60.00)
Glucose, Bld: 61 mg/dL — ABNORMAL LOW (ref 70–99)
Potassium: 4.1 meq/L (ref 3.5–5.1)
Sodium: 141 meq/L (ref 135–145)

## 2023-02-16 LAB — CBC WITH DIFFERENTIAL/PLATELET
Basophils Absolute: 0 10*3/uL (ref 0.0–0.1)
Basophils Relative: 0.6 % (ref 0.0–3.0)
Eosinophils Absolute: 0.1 10*3/uL (ref 0.0–0.7)
Eosinophils Relative: 2 % (ref 0.0–5.0)
HCT: 45.1 % (ref 39.0–52.0)
Hemoglobin: 15.3 g/dL (ref 13.0–17.0)
Lymphocytes Relative: 13.5 % (ref 12.0–46.0)
Lymphs Abs: 0.9 10*3/uL (ref 0.7–4.0)
MCHC: 33.9 g/dL (ref 30.0–36.0)
MCV: 95.3 fL (ref 78.0–100.0)
Monocytes Absolute: 0.6 10*3/uL (ref 0.1–1.0)
Monocytes Relative: 9.5 % (ref 3.0–12.0)
Neutro Abs: 4.9 10*3/uL (ref 1.4–7.7)
Neutrophils Relative %: 74.4 % (ref 43.0–77.0)
Platelets: 254 10*3/uL (ref 150.0–400.0)
RBC: 4.73 Mil/uL (ref 4.22–5.81)
RDW: 12.7 % (ref 11.5–15.5)
WBC: 6.6 10*3/uL (ref 4.0–10.5)

## 2023-02-16 LAB — MAGNESIUM: Magnesium: 1.9 mg/dL (ref 1.5–2.5)

## 2023-02-16 LAB — CK: Total CK: 116 U/L (ref 7–232)

## 2023-02-16 LAB — TSH: TSH: 1.15 u[IU]/mL (ref 0.35–5.50)

## 2023-02-16 LAB — T4, FREE: Free T4: 0.8 ng/dL (ref 0.60–1.60)

## 2023-02-20 NOTE — Progress Notes (Signed)
Nl x BG showing low     but that could be a lab effect and dont think clinically significant

## 2023-02-20 NOTE — Telephone Encounter (Signed)
Bp acceptable for now  If y ou leg symptoms  are better  you can stay off med and  Send in more BP readings in 3-4 weeks

## 2023-03-14 ENCOUNTER — Other Ambulatory Visit: Payer: Self-pay

## 2023-03-14 NOTE — Telephone Encounter (Signed)
Copied from CRM [id number]. Topic: Clinical - Medication Refill >> Mar 14, 2023 10:30 AM Theodis Sato wrote: Most Recent Primary Care Visit:  Provider: [facility]  Department: [facility]  Visit Type: LAB  Date: 02/16/2023  Medication: amphetamine-dextroamphetamine (ADDERALL XR) 30 MG 24 hr capsule    Has the patient contacted their pharmacy? Yes- PT is out of re-fills (Agent: If no, request that the patient contact the pharmacy for the refill. If patient does not wish to contact the pharmacy document the reason why and proceed with request.) (Agent: If yes, when and what did the pharmacy advise?)  Is this the correct pharmacy for this prescription? Yes If no, delete pharmacy and type the correct one.  This is the patient's preferred pharmacy:   [id number] [place], [place] - [address] [place] [address] Phone: [phone] Fax: [phone]     Has the prescription been filled recently? Yes  Is the patient out of the medication? No  Has the patient been seen for an appointment in the last year OR does the patient have an upcoming appointment? Yes  Can we respond through MyChart? Yes  Agent: Please be advised that Rx refills may take up to 3 business days. We ask that you follow-up with your pharmacy.

## 2023-03-14 NOTE — Addendum Note (Signed)
Addended byVickii Chafe on: 03/14/2023 03:56 PM   Modules accepted: Orders

## 2023-03-15 MED ORDER — AMPHETAMINE-DEXTROAMPHET ER 30 MG PO CP24: 30.0000 mg | ORAL_CAPSULE | Freq: Every day | ORAL | 0 refills | Status: DC

## 2023-03-15 NOTE — Telephone Encounter (Signed)
 Contacted pt. Pt is aware.

## 2023-04-09 ENCOUNTER — Encounter: Payer: Self-pay | Admitting: Internal Medicine

## 2023-04-13 ENCOUNTER — Other Ambulatory Visit: Payer: Self-pay | Admitting: Internal Medicine

## 2023-04-13 MED ORDER — AMPHETAMINE-DEXTROAMPHET ER 30 MG PO CP24: 30.0000 mg | ORAL_CAPSULE | Freq: Every day | ORAL | 0 refills | Status: DC

## 2023-05-10 ENCOUNTER — Other Ambulatory Visit: Payer: Self-pay | Admitting: Internal Medicine

## 2023-05-10 NOTE — Telephone Encounter (Signed)
Copied from CRM [id number]. Topic: Clinical - Medication Refill >> May 10, 2023  3:30 PM Corin V wrote: Most Recent Primary Care Visit:  Provider: [facility]  Department: [facility]  Visit Type: LAB  Date: 02/16/2023  Medication: amphetamine-dextroamphetamine (ADDERALL XR) 30 MG 24 hr capsule  Has the patient contacted their pharmacy? Yes (Agent: If no, request that the patient contact the pharmacy for the refill. If patient does not wish to contact the pharmacy document the reason why and proceed with request.) (Agent: If yes, when and what did the pharmacy advise?)  Is this the correct pharmacy for this prescription? Yes If no, delete pharmacy and type the correct one.  This is the patient's preferred pharmacy:  [id number] [place], [place] - [address] [place] [address] Phone: [phone] Fax: [phone] Has the prescription been filled recently? Yes  Is the patient out of the medication? No  Has the patient been seen for an appointment in the last year OR does the patient have an upcoming appointment? Yes  Can we respond through MyChart? No  Agent: Please be advised that Rx refills may take up to 3 business days. We ask that you follow-up with your pharmacy.

## 2023-05-11 MED ORDER — AMPHETAMINE-DEXTROAMPHET ER 30 MG PO CP24: 30.0000 mg | ORAL_CAPSULE | Freq: Every day | ORAL | 0 refills | Status: DC

## 2023-05-16 ENCOUNTER — Telehealth: Payer: Self-pay | Admitting: *Deleted

## 2023-05-16 NOTE — Telephone Encounter (Signed)
 Copied from CRM [id number]. Topic: Clinical - Prescription Issue >> May 16, 2023  9:12 AM Elizebeth Brooking wrote: Reason for CRM: Patient called in regarding his prescription amphetamine-dextroamphetamine (ADDERALL XR) 30 MG 24 hr capsule stated he called last week to get that sent to the pharmacy, is requesting someone to call him back regarding this issue

## 2023-05-17 ENCOUNTER — Telehealth: Payer: Self-pay

## 2023-05-17 MED ORDER — AMPHETAMINE-DEXTROAMPHET ER 30 MG PO CP24: 30.0000 mg | ORAL_CAPSULE | Freq: Every day | ORAL | 0 refills | Status: DC

## 2023-05-17 NOTE — Telephone Encounter (Signed)
 Copied from CRM [id number]. Topic: Clinical - Prescription Issue >> May 17, 2023  9:07 AM Isabell A wrote: Reason for CRM: Patient states he is out of medication ADDERALL XR - It has been sent to the wrong pharmacy, should've been sent to:  [id number] [place] [facility] - [address] [place], [place] [address]   Not [facility] [place] - [address] [place] [facility] [address] [place] [address]

## 2023-05-17 NOTE — Telephone Encounter (Signed)
 [facility] [address] by Dr[facility] Fabian Sharp.   Pt is aware.

## 2023-05-17 NOTE — Telephone Encounter (Signed)
 Pt is aware.

## 2023-05-23 ENCOUNTER — Encounter: Payer: Self-pay | Admitting: Internal Medicine

## 2023-05-23 DIAGNOSIS — Z3009 Encounter for other general counseling and advice on contraception: Secondary | ICD-10-CM

## 2023-05-30 DIAGNOSIS — Z3009 Encounter for other general counseling and advice on contraception: Secondary | ICD-10-CM | POA: Diagnosis not present

## 2023-06-07 ENCOUNTER — Other Ambulatory Visit: Payer: Self-pay | Admitting: Internal Medicine

## 2023-06-08 MED ORDER — AMPHETAMINE-DEXTROAMPHET ER 30 MG PO CP24: 30.0000 mg | ORAL_CAPSULE | Freq: Every day | ORAL | 0 refills | Status: DC

## 2023-06-26 NOTE — Progress Notes (Unsigned)
 No chief complaint on file.   HPI: Patient  Jonathan [name]  43 y.o. comes in today for Preventive Health Care visit  and med check  ADHD HT   Health Maintenance  Topic Date Due   Pneumococcal Vaccine 19-90 Years old (1 of 2 - PCV) Never done   HIV Screening  Never done   COVID-19 Vaccine (4 - 2024-25 season) 11/26/2022   INFLUENZA VACCINE  10/26/2023   DTaP/Tdap/Td (3 - Td or Tdap) 12/26/2029   Hepatitis C Screening  Completed   HPV VACCINES  Aged Out   Health Maintenance Review LIFESTYLE:  Exercise:   Tobacco/ETS: Alcohol:  Sugar beverages: Sleep: Drug use: no HH of  Work:    ROS:  GEN/ HEENT: No fever, significant weight changes sweats headaches vision problems hearing changes, CV/ PULM; No chest pain shortness of breath cough, syncope,edema  change in exercise tolerance. GI /GU: No adominal pain, vomiting, change in bowel habits. No blood in the stool. No significant GU symptoms. SKIN/HEME: ,no acute skin rashes suspicious lesions or bleeding. No lymphadenopathy, nodules, masses.  NEURO/ PSYCH:  No neurologic signs such as weakness numbness. No depression anxiety. IMM/ Allergy: No unusual infections.  Allergy .   REST of 12 system review negative except as per HPI   Past Medical History:  Diagnosis Date   ADD (attention deficit disorder)    Allergy    Asthma    hx consult Dr Maple Hudson.   Blood in stool 11/13/2014   painless  ? not mixed in  and gi opinoin  r no chnag ein bowel habits     IBS (irritable bowel syndrome)     Past Surgical History:  Procedure Laterality Date   UPPER GASTROINTESTINAL ENDOSCOPY     eval by GI neg   WRIST SURGERY     right- March 2016    Family History  Problem Relation Age of Onset   Hyperlipidemia Father    Colon cancer Neg Hx    Esophageal cancer Neg Hx    Rectal cancer Neg Hx    Stomach cancer Neg Hx     Social History   Socioeconomic History   Marital status: Married    Spouse name: Not on file   Number of  children: Not on file   Years of education: Not on file   Highest education level: Bachelor's degree (e.g., BA, AB, BS)  Occupational History   Not on file  Tobacco Use   Smoking status: Never   Smokeless tobacco: Former    Types: Chew    Quit date: 12/04/2014  Vaping Use   Vaping status: Never Used  Substance and Sexual Activity   Alcohol use: Yes    Alcohol/week: 0.0 standard drinks of alcohol    Comment: socially   Drug use: No   Sexual activity: Not on file  Other Topics Concern   Not on file  Social History Narrative   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       Regular exercise- yes runner 2 x per week  Lift 2 x per week    No ets.    workign from home during covid pandemic rstrictions   Social Drivers of Health   Financial Resource Strain: Low Risk  (06/26/2023)   Overall Financial Resource Strain (CARDIA)    Difficulty  of Paying Living Expenses: Not hard at all  Food Insecurity: No Food Insecurity (06/26/2023)   Hunger Vital Sign    Worried About Running Out of Food in the Last Year: Never true    Ran Out of Food in the Last Year: Never true  Transportation Needs: No Transportation Needs (06/26/2023)   PRAPARE - Administrator, Civil Service (Medical): No    Lack of Transportation (Non-Medical): No  Physical Activity: Insufficiently Active (06/26/2023)   Exercise Vital Sign    Days of Exercise per Week: 4 days    Minutes of Exercise per Session: 30 min  Stress: No Stress Concern Present (06/26/2023)   [employer] of Occupational Health - Occupational Stress Questionnaire    Feeling of Stress : Not at all  Social Connections: Moderately Integrated (06/26/2023)   Social Connection and Isolation Panel [NHANES]    Frequency of Communication with Friends and Family: Once a week    Frequency of Social Gatherings with Friends and Family: Once a week     Attends Religious Services: 1 to 4 times per year    Active Member of [employer] or Organizations: Yes    Attends Engineer, structural: More than 4 times per year    Marital Status: Married    Outpatient Medications Prior to Visit  Medication Sig Dispense Refill   amphetamine-dextroamphetamine (ADDERALL XR) 30 MG 24 hr capsule Take 1 capsule (30 mg total) by mouth every morning. Fill second 30 capsule 0   amphetamine-dextroamphetamine (ADDERALL XR) 30 MG 24 hr capsule Take 1 capsule (30 mg total) by mouth daily. Fill first 30 capsule 0   amphetamine-dextroamphetamine (ADDERALL XR) 30 MG 24 hr capsule Take 1 capsule (30 mg total) by mouth daily. Fill last 30 capsule 0   azelastine (OPTIVAR) 0.05 % ophthalmic solution Place 1 drop into both eyes 2 (two) times daily. (Patient not taking: Reported on 09/26/2022) 6 mL 1   losartan (COZAAR) 25 MG tablet Take 1 tablet (25 mg total) by mouth daily. For elevated blood pressure 30 tablet 3   No facility-administered medications prior to visit.     EXAM:  There were no vitals taken for this visit.  There is no height or weight on file to calculate BMI. Wt Readings from Last 3 Encounters:  12/26/22 166 lb (75.3 kg)  09/26/22 163 lb (73.9 kg)  07/26/22 164 lb (74.4 kg)    Physical Exam: Vital signs reviewed [id number]:[id number] is a well-developed well-nourished alert cooperative    who appearsr stated age in no acute distress.  HEENT: normocephalic atraumatic , Eyes: PERRL EOM's full, conjunctiva clear, Nares: paten,t no deformity discharge or tenderness., Ears: no deformity EAC's clear TMs with normal landmarks. Mouth: clear OP, no lesions, edema.  Moist mucous membranes. Dentition in adequate repair. NECK: supple without masses, thyromegaly or bruits. CHEST/PULM:  Clear to auscultation and percussion breath sounds equal no wheeze , rales or rhonchi. No chest wall deformities or tenderness. Breast: normal by inspection . No dimpling, discharge, masses,  tenderness or discharge . CV: PMI is nondisplaced, S1 S2 no gallops, murmurs, rubs. Peripheral pulses are full without delay.No JVD .  ABDOMEN: Bowel sounds normal nontender  No guard or rebound, no hepato splenomegal no CVA tenderness.  No hernia. Extremtities:  No clubbing cyanosis or edema, no acute joint swelling or redness no focal atrophy NEURO:  Oriented x3, cranial nerves 3-12 appear to be intact, no obvious focal weakness,gait within normal limits no abnormal reflexes or asymmetrical  SKIN: No acute rashes normal turgor, color, no bruising or petechiae. PSYCH: Oriented, good eye contact, no obvious depression anxiety, cognition and judgment appear normal. LN: no cervical axillary inguinal adenopathy  Lab Results  Component Value Date   WBC 6.6 02/16/2023   HGB 15.3 02/16/2023   HCT 45.1 02/16/2023   PLT 254.0 02/16/2023   GLUCOSE 61 (L) 02/16/2023   CHOL 225 (H) 06/22/2022   TRIG 86.0 06/22/2022   HDL 69.10 06/22/2022   LDLCALC 138 (H) 06/22/2022   ALT 29 06/22/2022   AST 29 06/22/2022   NA 141 02/16/2023   K 4.1 02/16/2023   CL 101 02/16/2023   CREATININE 0.90 02/16/2023   BUN 15 02/16/2023   CO2 33 (H) 02/16/2023   TSH 1.15 02/16/2023   HGBA1C 5.2 05/28/2017    BP Readings from Last 3 Encounters:  12/26/22 (!) 140/90  09/26/22 133/80  06/22/22 (!) 144/82    Lab plan reviewed with patient   ASSESSMENT AND PLAN:  Discussed the following assessment and plan:  No diagnosis found. No follow-ups on file.  Patient Care Team: Madelin Headings, MD as PCP - General Andrena Mews, DO as Consulting Physician (Sports Medicine) There are no Patient Instructions on file for this visit.  Neta Mends. Taela Charbonneau M.D.

## 2023-06-27 ENCOUNTER — Encounter: Payer: Self-pay | Admitting: Internal Medicine

## 2023-06-27 ENCOUNTER — Other Ambulatory Visit: Payer: Self-pay | Admitting: Internal Medicine

## 2023-06-27 ENCOUNTER — Ambulatory Visit (INDEPENDENT_AMBULATORY_CARE_PROVIDER_SITE_OTHER): Payer: Commercial Managed Care - PPO | Admitting: Internal Medicine

## 2023-06-27 VITALS — BP 128/86 | HR 80 | Temp 97.5°F | Ht 69.5 in | Wt 170.6 lb

## 2023-06-27 DIAGNOSIS — I10 Essential (primary) hypertension: Secondary | ICD-10-CM

## 2023-06-27 DIAGNOSIS — R5383 Other fatigue: Secondary | ICD-10-CM | POA: Diagnosis not present

## 2023-06-27 DIAGNOSIS — Z79899 Other long term (current) drug therapy: Secondary | ICD-10-CM | POA: Diagnosis not present

## 2023-06-27 DIAGNOSIS — R4189 Other symptoms and signs involving cognitive functions and awareness: Secondary | ICD-10-CM

## 2023-06-27 DIAGNOSIS — F988 Other specified behavioral and emotional disorders with onset usually occurring in childhood and adolescence: Secondary | ICD-10-CM | POA: Diagnosis not present

## 2023-06-27 DIAGNOSIS — E7841 Elevated Lipoprotein(a): Secondary | ICD-10-CM

## 2023-06-27 MED ORDER — AMPHETAMINE-DEXTROAMPHET ER 30 MG PO CP24: 30.0000 mg | ORAL_CAPSULE | ORAL | 0 refills | Status: DC

## 2023-06-27 MED ORDER — AMPHETAMINE-DEXTROAMPHET ER 30 MG PO CP24: 30.0000 mg | ORAL_CAPSULE | Freq: Every day | ORAL | 0 refills | Status: DC

## 2023-06-27 NOTE — Patient Instructions (Addendum)
 Can update lab and endocrine   information.     Plan fasting am  lab   but hydrated .   Monitor BP readings . In interim  goal below 130/80 average .   Plan fu  after lab depending  or 4-6 months

## 2023-06-28 ENCOUNTER — Other Ambulatory Visit (INDEPENDENT_AMBULATORY_CARE_PROVIDER_SITE_OTHER)

## 2023-06-28 DIAGNOSIS — Z79899 Other long term (current) drug therapy: Secondary | ICD-10-CM | POA: Diagnosis not present

## 2023-06-28 DIAGNOSIS — E7841 Elevated Lipoprotein(a): Secondary | ICD-10-CM | POA: Diagnosis not present

## 2023-06-28 DIAGNOSIS — R5383 Other fatigue: Secondary | ICD-10-CM | POA: Diagnosis not present

## 2023-06-28 DIAGNOSIS — F988 Other specified behavioral and emotional disorders with onset usually occurring in childhood and adolescence: Secondary | ICD-10-CM

## 2023-06-28 DIAGNOSIS — R4189 Other symptoms and signs involving cognitive functions and awareness: Secondary | ICD-10-CM

## 2023-06-28 LAB — BASIC METABOLIC PANEL WITH GFR
BUN: 14 mg/dL (ref 6–23)
CO2: 30 meq/L (ref 19–32)
Calcium: 9.3 mg/dL (ref 8.4–10.5)
Chloride: 102 meq/L (ref 96–112)
Creatinine, Ser: 0.92 mg/dL (ref 0.40–1.50)
GFR: 101.66 mL/min (ref >60.00)
Glucose, Bld: 90 mg/dL (ref 70–99)
Potassium: 4.4 meq/L (ref 3.5–5.1)
Sodium: 139 meq/L (ref 135–145)

## 2023-06-28 LAB — HEPATIC FUNCTION PANEL
ALT: 37 U/L (ref 0–53)
AST: 27 U/L (ref 0–37)
Albumin: 4.4 g/dL (ref 3.5–5.2)
Alkaline Phosphatase: 76 U/L (ref 39–117)
Bilirubin, Direct: 0.2 mg/dL (ref 0.0–0.3)
Total Bilirubin: 0.6 mg/dL (ref 0.2–1.2)
Total Protein: 6.8 g/dL (ref 6.0–8.3)

## 2023-06-28 LAB — LIPID PANEL
Cholesterol: 223 mg/dL — ABNORMAL HIGH (ref 0–200)
HDL: 66.5 mg/dL (ref >39.00)
LDL Cholesterol: 143 mg/dL — ABNORMAL HIGH (ref 0–99)
NonHDL: 156.83
Total CHOL/HDL Ratio: 3
Triglycerides: 69 mg/dL (ref 0.0–149.0)
VLDL: 13.8 mg/dL (ref 0.0–40.0)

## 2023-06-28 LAB — LUTEINIZING HORMONE: LH: 5.49 m[IU]/mL (ref 1.50–9.30)

## 2023-06-28 LAB — TSH: TSH: 2.33 u[IU]/mL (ref 0.35–5.50)

## 2023-06-28 LAB — CBC WITH DIFFERENTIAL/PLATELET
Basophils Absolute: 0 10*3/uL (ref 0.0–0.1)
Basophils Relative: 0.6 % (ref 0.0–3.0)
Eosinophils Absolute: 0.1 10*3/uL (ref 0.0–0.7)
Eosinophils Relative: 1.2 % (ref 0.0–5.0)
HCT: 42.4 % (ref 39.0–52.0)
Hemoglobin: 14.7 g/dL (ref 13.0–17.0)
Lymphocytes Relative: 27.1 % (ref 12.0–46.0)
Lymphs Abs: 1.2 10*3/uL (ref 0.7–4.0)
MCHC: 34.7 g/dL (ref 30.0–36.0)
MCV: 94.1 fl (ref 78.0–100.0)
Monocytes Absolute: 0.4 10*3/uL (ref 0.1–1.0)
Monocytes Relative: 9.3 % (ref 3.0–12.0)
Neutro Abs: 2.7 10*3/uL (ref 1.4–7.7)
Neutrophils Relative %: 61.8 % (ref 43.0–77.0)
Platelets: 239 10*3/uL (ref 150.0–400.0)
RBC: 4.5 Mil/uL (ref 4.22–5.81)
RDW: 12.5 % (ref 11.5–15.5)
WBC: 4.4 10*3/uL (ref 4.0–10.5)

## 2023-06-28 LAB — HEMOGLOBIN A1C: Hgb A1c MFr Bld: 5.1 % (ref 4.6–6.5)

## 2023-06-28 LAB — FOLLICLE STIMULATING HORMONE: FSH: 3.2 m[IU]/mL (ref 1.4–18.1)

## 2023-06-28 LAB — T4, FREE: Free T4: 0.83 ng/dL (ref 0.60–1.60)

## 2023-06-28 LAB — TESTOSTERONE: Testosterone: 509.38 ng/dL (ref 300.00–890.00)

## 2023-06-29 LAB — CORTISOL-AM, BLOOD: Cortisol - AM: 17 ug/dL

## 2023-06-29 LAB — PROLACTIN: Prolactin: 10.3 ng/mL (ref 2.0–18.0)

## 2023-07-01 ENCOUNTER — Encounter: Payer: Self-pay | Admitting: Internal Medicine

## 2023-07-01 NOTE — Progress Notes (Signed)
 Testosterone level  other hormone levels are normal   no evidence of low T syndrome or adrenal thyroid abnormality . Cholesterol  panel about the same

## 2023-08-10 DIAGNOSIS — Z302 Encounter for sterilization: Secondary | ICD-10-CM | POA: Diagnosis not present

## 2023-09-20 DIAGNOSIS — R112 Nausea with vomiting, unspecified: Secondary | ICD-10-CM | POA: Diagnosis not present

## 2023-09-20 DIAGNOSIS — R42 Dizziness and giddiness: Secondary | ICD-10-CM | POA: Diagnosis not present

## 2023-09-20 DIAGNOSIS — I10 Essential (primary) hypertension: Secondary | ICD-10-CM | POA: Diagnosis not present

## 2023-09-20 DIAGNOSIS — X30XXXA Exposure to excessive natural heat, initial encounter: Secondary | ICD-10-CM | POA: Diagnosis not present

## 2023-09-20 DIAGNOSIS — T679XXA Effect of heat and light, unspecified, initial encounter: Secondary | ICD-10-CM | POA: Diagnosis not present

## 2023-09-20 DIAGNOSIS — T675XXA Heat exhaustion, unspecified, initial encounter: Secondary | ICD-10-CM | POA: Diagnosis not present

## 2023-09-20 DIAGNOSIS — R110 Nausea: Secondary | ICD-10-CM | POA: Diagnosis not present

## 2023-09-20 DIAGNOSIS — Y9302 Activity, running: Secondary | ICD-10-CM | POA: Diagnosis not present

## 2023-09-26 ENCOUNTER — Other Ambulatory Visit: Payer: Self-pay | Admitting: Internal Medicine

## 2023-09-26 DIAGNOSIS — F988 Other specified behavioral and emotional disorders with onset usually occurring in childhood and adolescence: Secondary | ICD-10-CM

## 2023-09-26 NOTE — Telephone Encounter (Unsigned)
 Copied from CRM [id number]. Topic: Clinical - Medication Refill >> Sep 26, 2023  3:42 PM Thersia C wrote: Medication: amphetamine -dextroamphetamine (ADDERALL XR) 30 MG 24 hr capsule   Has the patient contacted their pharmacy? Yes (Agent: If no, request that the patient contact the pharmacy for the refill. If patient does not wish to contact the pharmacy document the reason why and proceed with request.) (Agent: If yes, when and what did the pharmacy advise?)  This is the patient's preferred pharmacy:  [id number] [address] [facility] - [address] [place] [address] Phone: [phone] Fax: [phone]   Is this the correct pharmacy for this prescription? Yes If no, delete pharmacy and type the correct one.   Has the prescription been filled recently? No  Is the patient out of the medication? Yes  Has the patient been seen for an appointment in the last year OR does the patient have an upcoming appointment? Yes  Can we respond through MyChart? Yes  Agent: Please be advised that Rx refills may take up to 3 business days. We ask that you follow-up with your pharmacy.

## 2023-09-27 MED ORDER — AMPHETAMINE-DEXTROAMPHET ER 30 MG PO CP24: 30.0000 mg | ORAL_CAPSULE | ORAL | 0 refills | Status: DC

## 2023-09-27 MED ORDER — AMPHETAMINE-DEXTROAMPHET ER 30 MG PO CP24: 30.0000 mg | ORAL_CAPSULE | Freq: Every day | ORAL | 0 refills | Status: DC

## 2023-09-27 NOTE — Telephone Encounter (Signed)
 Spoke to pt and inform him: Dr. Charlett sent in the Rx. Pt verbalized understanding.

## 2023-10-25 NOTE — Telephone Encounter (Unsigned)
 Copied from CRM [id number]. Topic: Clinical - Medication Refill >> Oct 25, 2023 12:20 PM [name] wrote: Medication: amphetamine -dextroamphetamine (ADDERALL XR) 30 MG 24 hr capsule  Has the patient contacted their pharmacy? No (Agent: If no, request that the patient contact the pharmacy for the refill. If patient does not wish to contact the pharmacy document the reason why and proceed with request.) (Agent: If yes, when and what did the pharmacy advise?)  This is the patient's preferred pharmacy:  [id number] [place] [facility] - [address] [place] [address] Phone: [phone] Fax: [phone]  Is this the correct pharmacy for this prescription? Yes If no, delete pharmacy and type the correct one.   Has the prescription been filled recently? Yes  Is the patient out of the medication? No  Has the patient been seen for an appointment in the last year OR does the patient have an upcoming appointment? Yes  Can we respond through MyChart? Yes  Agent: Please be advised that Rx refills may take up to 3 business days. We ask that you follow-up with your pharmacy.

## 2023-11-21 ENCOUNTER — Other Ambulatory Visit: Payer: Self-pay | Admitting: Internal Medicine

## 2023-11-21 NOTE — Telephone Encounter (Unsigned)
 Copied from CRM [id number]. Topic: Clinical - Medication Refill >> Nov 21, 2023 12:32 PM Rea ORN wrote: Medication: amphetamine -dextroamphetamine (ADDERALL XR) 30 MG 24 hr capsule  Has the patient contacted their pharmacy? No (Agent: If no, request that the patient contact the pharmacy for the refill. If patient does not wish to contact the pharmacy document the reason why and proceed with request.) (Agent: If yes, when and what did the pharmacy advise?)  This is the patient's preferred pharmacy:  [id number] [place] [facility] - [address] [place] [address] Phone: [phone] Fax: [phone]  Is this the correct pharmacy for this prescription? Yes If no, delete pharmacy and type the correct one.   Has the prescription been filled recently? No  Is the patient out of the medication? No  Has the patient been seen for an appointment in the last year OR does the patient have an upcoming appointment? Yes  Can we respond through MyChart? Yes  Agent: Please be advised that Rx refills may take up to 3 business days. We ask that you follow-up with your pharmacy.

## 2023-11-22 MED ORDER — AMPHETAMINE-DEXTROAMPHET ER 30 MG PO CP24: 30.0000 mg | ORAL_CAPSULE | Freq: Every day | ORAL | 0 refills | Status: DC

## 2023-12-21 ENCOUNTER — Other Ambulatory Visit: Payer: Self-pay | Admitting: Internal Medicine

## 2023-12-21 DIAGNOSIS — F988 Other specified behavioral and emotional disorders with onset usually occurring in childhood and adolescence: Secondary | ICD-10-CM

## 2023-12-21 NOTE — Telephone Encounter (Signed)
 Copied from CRM [id number]. Topic: Clinical - Medication Refill >> Dec 21, 2023  1:06 PM Tysheama G wrote: Medication: amphetamine -dextroamphetamine (ADDERALL XR) 30 MG 24 hr capsule    Has the patient contacted their pharmacy? No (Agent: If no, request that the patient contact the pharmacy for the refill. If patient does not wish to contact the pharmacy document the reason why and proceed with request.) (Agent: If yes, when and what did the pharmacy advise?)  This is the patient's preferred pharmacy:  [id number] [address] [facility] - [address] [place] [address] Phone: [phone] Fax: [phone]    Is this the correct pharmacy for this prescription? Yes If no, delete pharmacy and type the correct one.   Has the prescription been filled recently? No  Is the patient out of the medication? No  Has the patient been seen for an appointment in the last year OR does the patient have an upcoming appointment? Yes  Can we respond through MyChart? Yes  Agent: Please be advised that Rx refills may take up to 3 business days. We ask that you follow-up with your pharmacy.

## 2023-12-25 MED ORDER — AMPHETAMINE-DEXTROAMPHET ER 30 MG PO CP24: 30.0000 mg | ORAL_CAPSULE | Freq: Every day | ORAL | 0 refills | Status: DC

## 2023-12-27 ENCOUNTER — Encounter: Payer: Self-pay | Admitting: Internal Medicine

## 2023-12-27 ENCOUNTER — Ambulatory Visit: Admitting: Internal Medicine

## 2023-12-27 VITALS — BP 138/86 | HR 79 | Temp 98.2°F | Ht 69.5 in | Wt 172.5 lb

## 2023-12-27 DIAGNOSIS — F988 Other specified behavioral and emotional disorders with onset usually occurring in childhood and adolescence: Secondary | ICD-10-CM | POA: Diagnosis not present

## 2023-12-27 DIAGNOSIS — Z23 Encounter for immunization: Secondary | ICD-10-CM

## 2023-12-27 DIAGNOSIS — E7841 Elevated Lipoprotein(a): Secondary | ICD-10-CM

## 2023-12-27 DIAGNOSIS — Z79899 Other long term (current) drug therapy: Secondary | ICD-10-CM

## 2023-12-27 NOTE — Progress Notes (Signed)
 Chief Complaint  Patient presents with   Medical Management of Chronic Issues    HPI: Jonathan [name] 43 y.o. come in for Chronic disease management  med check  ADD/ADHD: medication.  Still helpful  BP: usually  as above below 140/90 continues to run in running club and no cv sx .  Never began losartan  .  Has well 8 mos old at home      Past Medical History:  Diagnosis Date   ADD (attention deficit disorder)    Allergy    Asthma    hx consult Dr Neysa.   Blood in stool 11/13/2014   painless  ? not mixed in  and gi opinoin  r no chnag ein bowel habits     IBS (irritable bowel syndrome)     Family History  Problem Relation Age of Onset   Hyperlipidemia Father    Colon cancer Neg Hx    Esophageal cancer Neg Hx    Rectal cancer Neg Hx    Stomach cancer Neg Hx     Social History   Socioeconomic History   Marital status: Married    Spouse name: Not on file   Number of children: Not on file   Years of education: Not on file   Highest education level: Bachelor's degree (e.g., BA, AB, BS)  Occupational History   Not on file  Tobacco Use   Smoking status: Never   Smokeless tobacco: Former    Types: Chew    Quit date: 12/04/2014  Vaping Use   Vaping status: Never Used  Substance and Sexual Activity   Alcohol use: Yes    Alcohol/week: 0.0 standard drinks of alcohol    Comment: socially   Drug use: No   Sexual activity: Not on file  Other Topics Concern   Not on file  Social History Narrative   Occupation: 40 hours per week ( prev [employer])  Now works  for [employer]    Single engaged    [facility] of 2 pet dog  No ets.       Regular exercise- yes runner 2 x per week  Lift 2 x per week    No ets.    workign from home during covid pandemic rstrictions   Social Drivers of Health   Financial Resource Strain: Low Risk  (12/26/2023)   Overall Financial Resource Strain (CARDIA)    Difficulty of Paying Living Expenses: Not very hard  Food Insecurity:  No Food Insecurity (12/26/2023)   Hunger Vital Sign    Worried About Running Out of Food in the Last Year: Never true    Ran Out of Food in the Last Year: Never true  Transportation Needs: No Transportation Needs (12/26/2023)   PRAPARE - Administrator, Civil Service (Medical): No    Lack of Transportation (Non-Medical): No  Physical Activity: Sufficiently Active (12/26/2023)   Exercise Vital Sign    Days of Exercise per Week: 4 days    Minutes of Exercise per Session: 40 min  Stress: No Stress Concern Present (12/26/2023)   [employer] of Occupational Health - Occupational Stress Questionnaire    Feeling of Stress: Only a little  Social Connections: Moderately Isolated (12/26/2023)   Social Connection and Isolation Panel    Frequency of Communication with Friends and Family: Once a week    Frequency of Social Gatherings with Friends and Family: Once a week    Attends Religious Services: 1 to 4  times per year    Active Member of Clubs or Organizations: No    Attends Banker Meetings: Not on file    Marital Status: Married    Outpatient Medications Prior to Visit  Medication Sig Dispense Refill   amphetamine -dextroamphetamine (ADDERALL XR) 30 MG 24 hr capsule Take 1 capsule (30 mg total) by mouth every morning. Fill second 30 capsule 0   amphetamine -dextroamphetamine (ADDERALL XR) 30 MG 24 hr capsule Take 1 capsule (30 mg total) by mouth daily. Fill first 30 capsule 0   amphetamine -dextroamphetamine (ADDERALL XR) 30 MG 24 hr capsule Take 1 capsule (30 mg total) by mouth daily. Fill last 30 capsule 0   azelastine  (OPTIVAR ) 0.05 % ophthalmic solution Place 1 drop into both eyes 2 (two) times daily. (Patient not taking: Reported on 12/27/2023) 6 mL 1   losartan  (COZAAR ) 25 MG tablet Take 1 tablet (25 mg total) by mouth daily. For elevated blood pressure (Patient not taking: Reported on 12/27/2023) 30 tablet 3   No facility-administered medications prior to visit.      EXAM:  BP 138/86 (BP Location: Right Arm, Patient Position: Sitting, Cuff Size: Large)   Pulse 79   Temp 98.2 F (36.8 C) (Oral)   Ht 5' 9.5 (1.765 m)   Wt 172 lb 8 oz (78.2 kg)   SpO2 98%   BMI 25.11 kg/m   Body mass index is 25.11 kg/m.  GENERAL: vitals reviewed and listed above, alert, oriented, appears well hydrated and in no acute distress HEENT: atraumatic, conjunctiva  clear, no obvious abnormalities on inspection of external nose and ears e  LUNGS: clear to auscultation bilaterally, no wheezes, rales or rhonchi, good air movement CV: HRRR, no clubbing cyanosisnl cap refill  MS: moves all extremities without noticeable focal  abnormality PSYCH: pleasant and cooperative, no obvious depression or anxiety Lab Results  Component Value Date   WBC 4.4 06/28/2023   HGB 14.7 06/28/2023   HCT 42.4 06/28/2023   PLT 239.0 06/28/2023   GLUCOSE 90 06/28/2023   CHOL 223 (H) 06/28/2023   TRIG 69.0 06/28/2023   HDL 66.50 06/28/2023   LDLCALC 143 (H) 06/28/2023   ALT 37 06/28/2023   AST 27 06/28/2023   NA 139 06/28/2023   K 4.4 06/28/2023   CL 102 06/28/2023   CREATININE 0.92 06/28/2023   BUN 14 06/28/2023   CO2 30 06/28/2023   TSH 2.33 06/28/2023   HGBA1C 5.1 06/28/2023   BP Readings from Last 3 Encounters:  12/27/23 138/86  06/27/23 128/86  12/26/22 (!) 140/90  Ct calcium score 0  Elevated lipo A    ASSESSMENT AND PLAN:  Discussed the following assessment and plan:  Attention deficit disorder, unspecified type  Medication management  Elevated lipoprotein A level  Influenza vaccine needed - Plan: Flu vaccine trivalent PF, 6mos and older(Flulaval,Afluria,Fluarix,Fluzone)  Disc bp guidelins  will continue  as long as  able to continue lsi and go from there .  Fu April labs and visit   Contact for med refill ok to do 30 x 3 at next refill  if requested  -Patient advised to return or notify health care team  if  new concerns arise.  Patient  Instructions  Good to [name] you today  Continue BP monitoring as planned.  6 mos  CPE with labs   Ok to refill 30 x 3 meds upon request    Jonathan [name] M.D.

## 2023-12-27 NOTE — Patient Instructions (Signed)
 Good to see you today  Continue BP monitoring as planned.  6 mos  CPE with labs   Ok to refill 30 x 3 meds upon request

## 2024-01-18 ENCOUNTER — Other Ambulatory Visit: Payer: Self-pay | Admitting: Internal Medicine

## 2024-01-18 DIAGNOSIS — F988 Other specified behavioral and emotional disorders with onset usually occurring in childhood and adolescence: Secondary | ICD-10-CM

## 2024-01-18 NOTE — Telephone Encounter (Signed)
 Patient is requesting 3 (1 month) supply.

## 2024-01-18 NOTE — Telephone Encounter (Signed)
 Copied from CRM [id number]. Topic: Clinical - Medication Refill >> Jan 18, 2024  3:06 PM Pinkey ORN wrote: Medication: amphetamine -dextroamphetamine (ADDERALL XR) 30 MG 24 hr capsule  Has the patient contacted their pharmacy? No (Agent: If no, request that the patient contact the pharmacy for the refill. If patient does not wish to contact the pharmacy document the reason why and proceed with request.) (Agent: If yes, when and what did the pharmacy advise?)  This is the patient's preferred pharmacy:  [id number] [address] [facility] - [address] [place] [address] Phone: [phone] Fax: [phone]   Is this the correct pharmacy for this prescription? Yes If no, delete pharmacy and type the correct one.   Has the prescription been filled recently? No  Is the patient out of the medication? No  Has the patient been seen for an appointment in the last year OR does the patient have an upcoming appointment? Yes  Can we respond through MyChart? Yes  Agent: Please be advised that Rx refills may take up to 3 business days. We ask that you follow-up with your pharmacy.

## 2024-01-21 MED ORDER — AMPHETAMINE-DEXTROAMPHET ER 30 MG PO CP24: 30.0000 mg | ORAL_CAPSULE | Freq: Every day | ORAL | 0 refills | Status: DC

## 2024-01-21 MED ORDER — AMPHETAMINE-DEXTROAMPHET ER 30 MG PO CP24: 30.0000 mg | ORAL_CAPSULE | Freq: Every day | ORAL | 0 refills | Status: AC

## 2024-01-21 MED ORDER — AMPHETAMINE-DEXTROAMPHET ER 30 MG PO CP24: 30.0000 mg | ORAL_CAPSULE | ORAL | 0 refills | Status: DC

## 2024-01-21 NOTE — Telephone Encounter (Signed)
 90 days refilled  30 x 3

## 2024-04-14 ENCOUNTER — Telehealth: Payer: Self-pay

## 2024-04-14 NOTE — Telephone Encounter (Signed)
 Copied from CRM #[id number]. Topic: Clinical - Medication Refill >> Apr 14, 2024 11:08 AM Vena HERO wrote: Medication: amphetamine -dextroamphetamine (ADDERALL XR) 30 MG 24 hr capsule  Three 30 day supply requested by Dr Charlett  Has the patient contacted their pharmacy? Yes (Agent: If no, request that the patient contact the pharmacy for the refill. If patient does not wish to contact the pharmacy document the reason why and proceed with request.) (Agent: If yes, when and what did the pharmacy advise?) Call provider  This is the patient's preferred pharmacy:  [id number] [address] [facility] - [address] [place] [address] Phone: [phone] Fax: [phone]   Is this the correct pharmacy for this prescription? Yes If no, delete pharmacy and type the correct one.   Has the prescription been filled recently? No  Is the patient out of the medication? No  Has the patient been seen for an appointment in the last year OR does the patient have an upcoming appointment? Yes  Can we respond through MyChart? Yes  Agent: Please be advised that Rx refills may take up to 3 business days. We ask that you follow-up with your pharmacy.

## 2024-04-16 MED ORDER — AMPHETAMINE-DEXTROAMPHET ER 30 MG PO CP24: 30.0000 mg | ORAL_CAPSULE | ORAL | 0 refills | Status: AC

## 2024-04-16 MED ORDER — AMPHETAMINE-DEXTROAMPHET ER 30 MG PO CP24: 30.0000 mg | ORAL_CAPSULE | Freq: Every day | ORAL | 0 refills | Status: AC

## 2024-04-16 NOTE — Addendum Note (Signed)
 Addended byBETHA CHARLETT APOLINAR MARLA on: 04/16/2024 01:37 PM   Modules accepted: Orders

## 2024-07-01 ENCOUNTER — Encounter: Admitting: Internal Medicine
